# Patient Record
Sex: Male | Born: 1950 | Race: Black or African American | Hispanic: No | Marital: Married | Smoking: Never smoker
Health system: Southern US, Community
[De-identification: ages and names within clinical notes are randomized; demographics above are authoritative.]

## PROBLEM LIST (undated history)

## (undated) DIAGNOSIS — C61 Malignant neoplasm of prostate: Secondary | ICD-10-CM

## (undated) DIAGNOSIS — A159 Respiratory tuberculosis unspecified: Secondary | ICD-10-CM

## (undated) DIAGNOSIS — I10 Essential (primary) hypertension: Secondary | ICD-10-CM

## (undated) DIAGNOSIS — D649 Anemia, unspecified: Secondary | ICD-10-CM

## (undated) DIAGNOSIS — M1990 Unspecified osteoarthritis, unspecified site: Secondary | ICD-10-CM

## (undated) HISTORY — PX: PROSTATE BIOPSY: SHX241

## (undated) HISTORY — PX: TONSILLECTOMY: SUR1361

---

## 1981-06-06 HISTORY — PX: TRANSPHENOIDAL / TRANSNASAL HYPOPHYSECTOMY / RESECTION PITUITARY TUMOR: SUR1382

## 2014-03-03 ENCOUNTER — Ambulatory Visit (HOSPITAL_BASED_OUTPATIENT_CLINIC_OR_DEPARTMENT_OTHER): Attending: Internal Medicine | Admitting: Radiology

## 2014-03-03 VITALS — Ht 70.5 in | Wt 183.0 lb

## 2014-03-03 DIAGNOSIS — G4733 Obstructive sleep apnea (adult) (pediatric): Secondary | ICD-10-CM | POA: Insufficient documentation

## 2014-03-08 DIAGNOSIS — G4733 Obstructive sleep apnea (adult) (pediatric): Secondary | ICD-10-CM

## 2014-03-08 NOTE — Sleep Study (Signed)
   NAME: Frederick Beard DATE OF BIRTH:  1950/12/27 MEDICAL RECORD NUMBER [id number]  LOCATION: [facility]  PHYSICIAN: Zackery Brine D  DATE OF STUDY: 03/03/2014  SLEEP STUDY TYPE: Nocturnal Polysomnogram               REFERRING PHYSICIAN: Willey Blade, MD  INDICATION FOR STUDY: Hypersomnia with sleep apnea  EPWORTH SLEEPINESS SCORE:   14/24 HEIGHT: 5' 10.5" (179.1 cm)  WEIGHT: 183 lb (83.008 kg)    Body mass index is 25.88 kg/(m^2).  NECK SIZE: 14.5 in.  MEDICATIONS: Charted for review  SLEEP ARCHITECTURE: Total sleep time 336 minutes with sleep efficiency 82.8%. Stage I was 11.9%, stage II 74.1%, stage III absent, REM 14% of total sleep time. Sleep latency 12.5 minutes, REM latency 104.5 minutes, awake after sleep onset 57.5 minutes, arousal index 27, bedtime medication: None  RESPIRATORY DATA: Apnea hypopneas syndrome (AHI) 5.2 per hour. 29 total events scored including one obstructive apnea, 2 central apneas, 26 hypopneas. Events were not positional. REM AHI 12.8 per hour. There were not enough events to permit application of split protocol CPAP titration.  OXYGEN DATA: Occasional moderate snoring with oxygen desaturation to a nadir of 89% and mean saturation 93.2% on room air.  CARDIAC DATA: Sinus rhythm with PACs and PVCs  MOVEMENT/PARASOMNIA: A few limb jerks were noted with little effect on sleep. Bathroom x1  IMPRESSION/ RECOMMENDATION:   1) Minimal obstructive sleep apnea/hypopneas syndrome, AHI 5.2 per hour. The normal range for adults is an AHI from 0-5 events per hour and scores are considered mild up to 15 per hour. Non-positional events. REM AHI 12.8 per hour. Occasional moderate snoring with oxygen desaturation to a nadir of 89% and mean saturation 93.2% on room air. 2) There were not enough events to permit application of split CPAP titration by protocol.   Deneise Lever Diplomate, American Board of Sleep Medicine  ELECTRONICALLY SIGNED  ON:  03/08/2014, 10:22 AM [place] PH: [phone]   FX: [phone] [place]

## 2015-03-04 DIAGNOSIS — D353 Benign neoplasm of craniopharyngeal duct: Secondary | ICD-10-CM | POA: Insufficient documentation

## 2015-03-04 DIAGNOSIS — D352 Benign neoplasm of pituitary gland: Secondary | ICD-10-CM | POA: Insufficient documentation

## 2015-03-04 DIAGNOSIS — E291 Testicular hypofunction: Secondary | ICD-10-CM | POA: Insufficient documentation

## 2015-03-04 DIAGNOSIS — E559 Vitamin D deficiency, unspecified: Secondary | ICD-10-CM | POA: Insufficient documentation

## 2015-12-16 ENCOUNTER — Other Ambulatory Visit: Payer: Self-pay | Admitting: Internal Medicine

## 2015-12-16 DIAGNOSIS — R600 Localized edema: Secondary | ICD-10-CM

## 2015-12-21 ENCOUNTER — Ambulatory Visit
Admission: RE | Admit: 2015-12-21 | Discharge: 2015-12-21 | Disposition: A | Discharge status: Home / Self Care | Payer: Medicare Other | Source: Ambulatory Visit | Attending: Internal Medicine | Admitting: Internal Medicine

## 2015-12-21 ENCOUNTER — Other Ambulatory Visit: Payer: Self-pay | Admitting: Internal Medicine

## 2015-12-21 DIAGNOSIS — M25561 Pain in right knee: Secondary | ICD-10-CM

## 2015-12-21 DIAGNOSIS — R600 Localized edema: Secondary | ICD-10-CM

## 2015-12-21 IMAGING — CR DG KNEE COMPLETE 4+V*R*
3 series · 3 of 3 positions shown · non-contrast
Comparison: No prior .

CLINICAL DATA: Chronic right knee pain.  No known injury.

EXAM:
RIGHT KNEE - COMPLETE 4+ VIEW

[w knee ap right (1 of 2)]
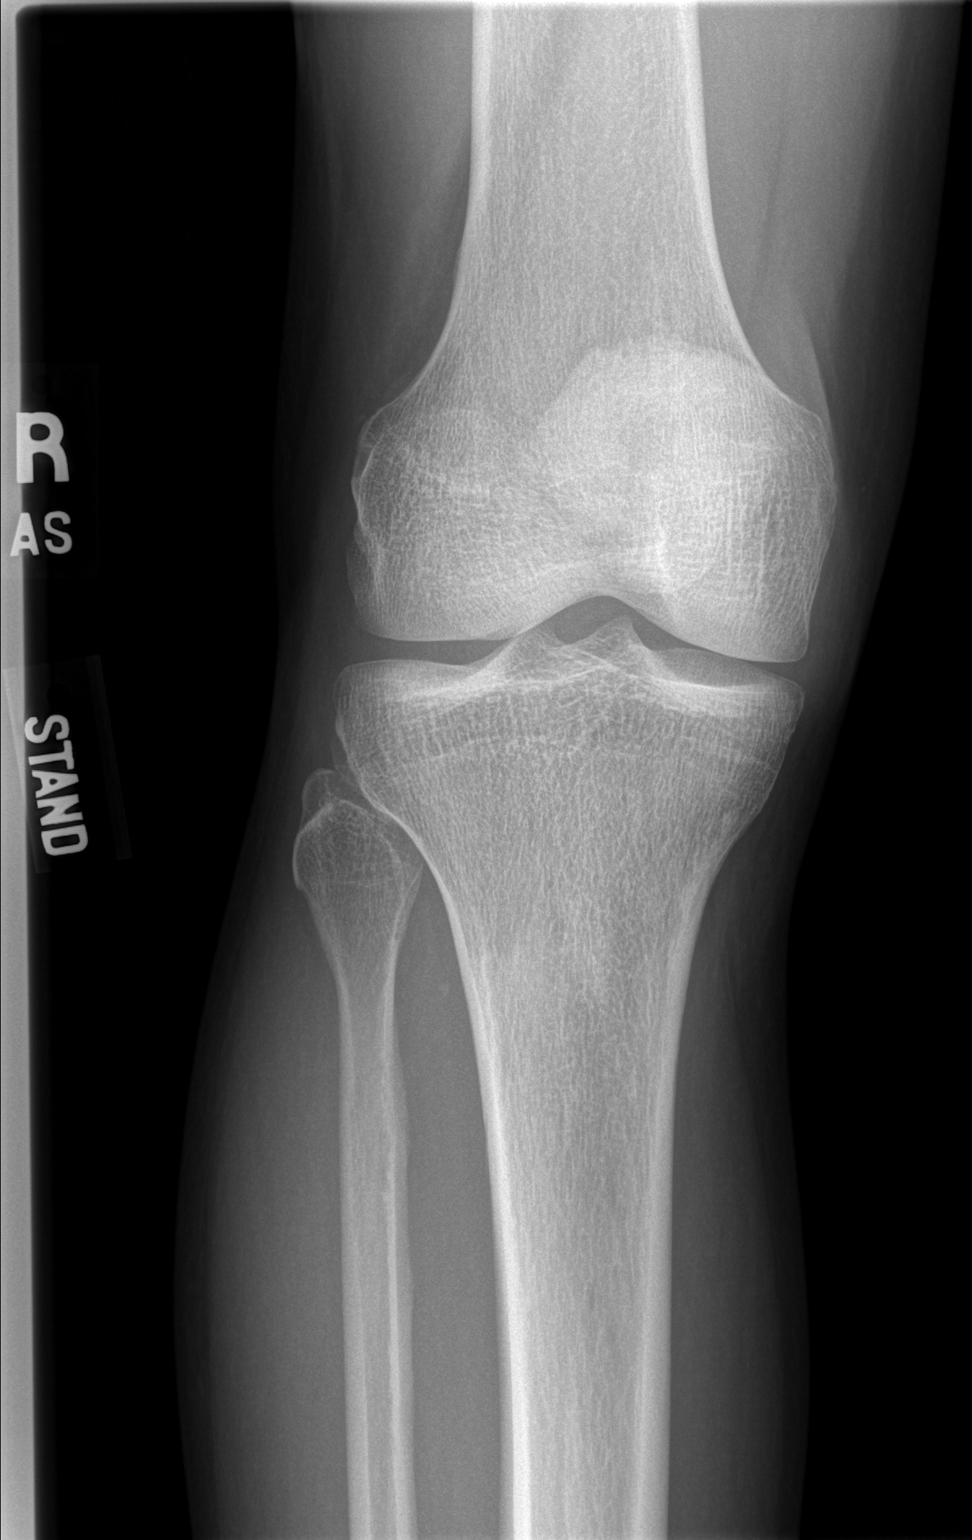

[w knee lat. right]
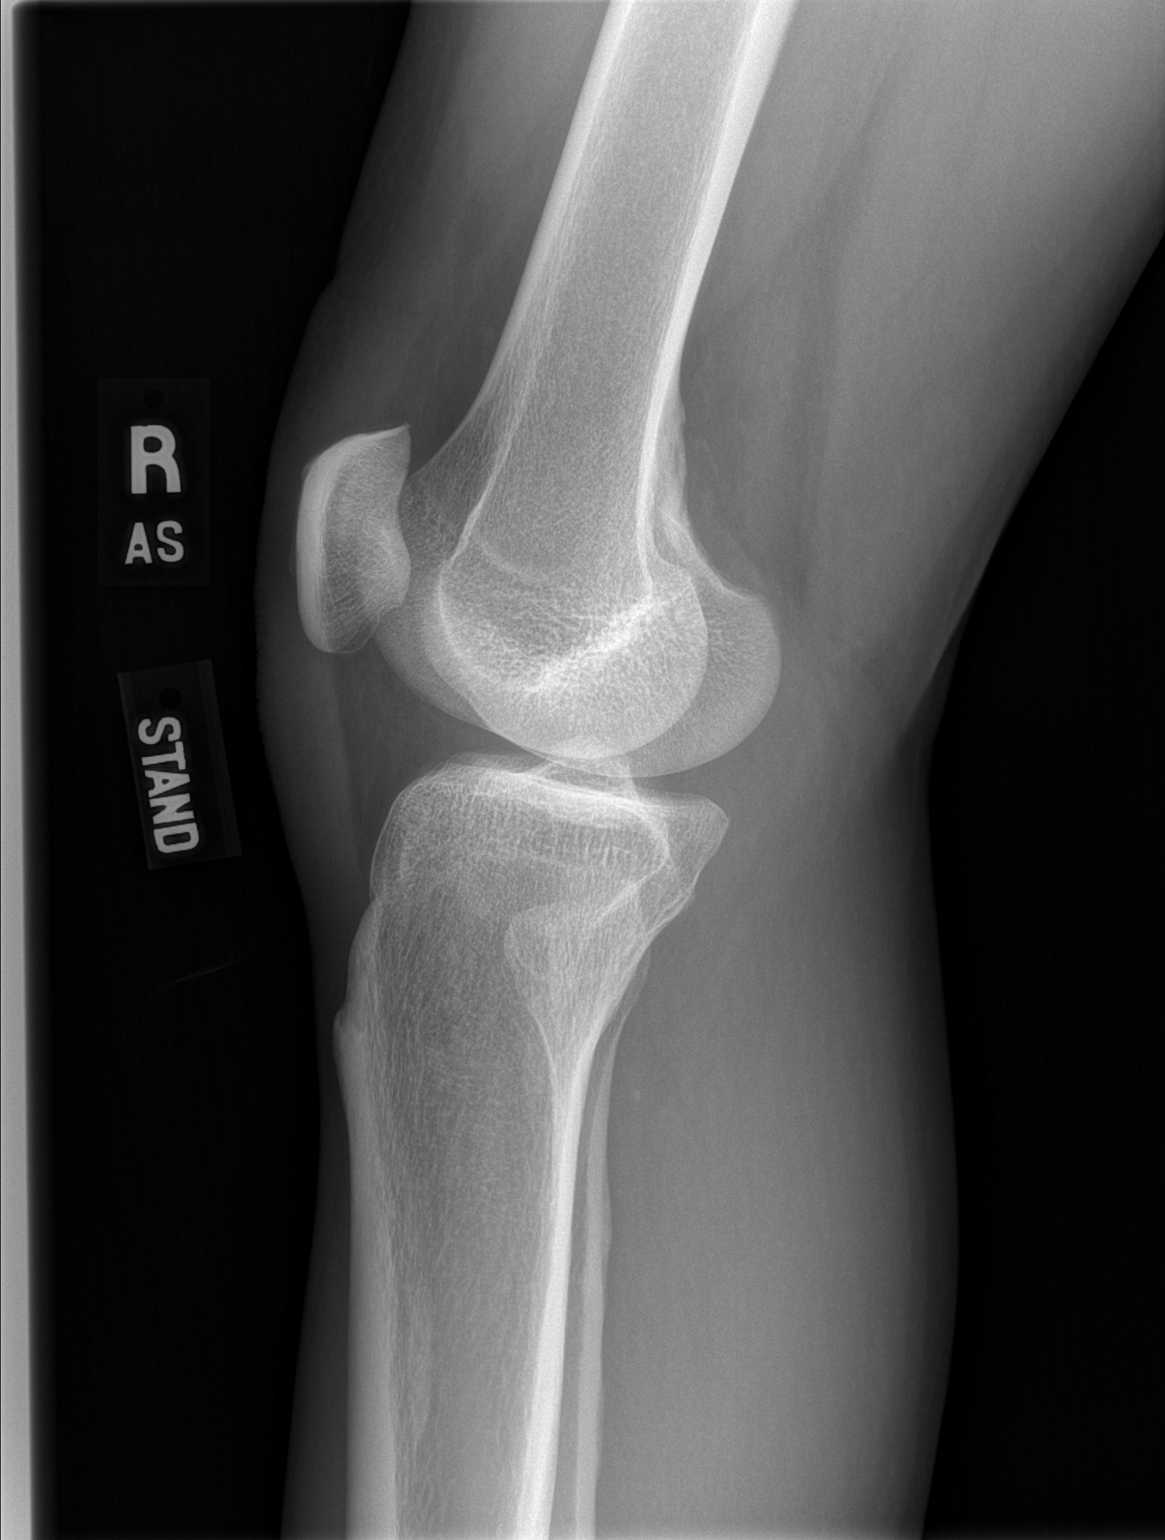

[w knee ap right (2 of 2)]
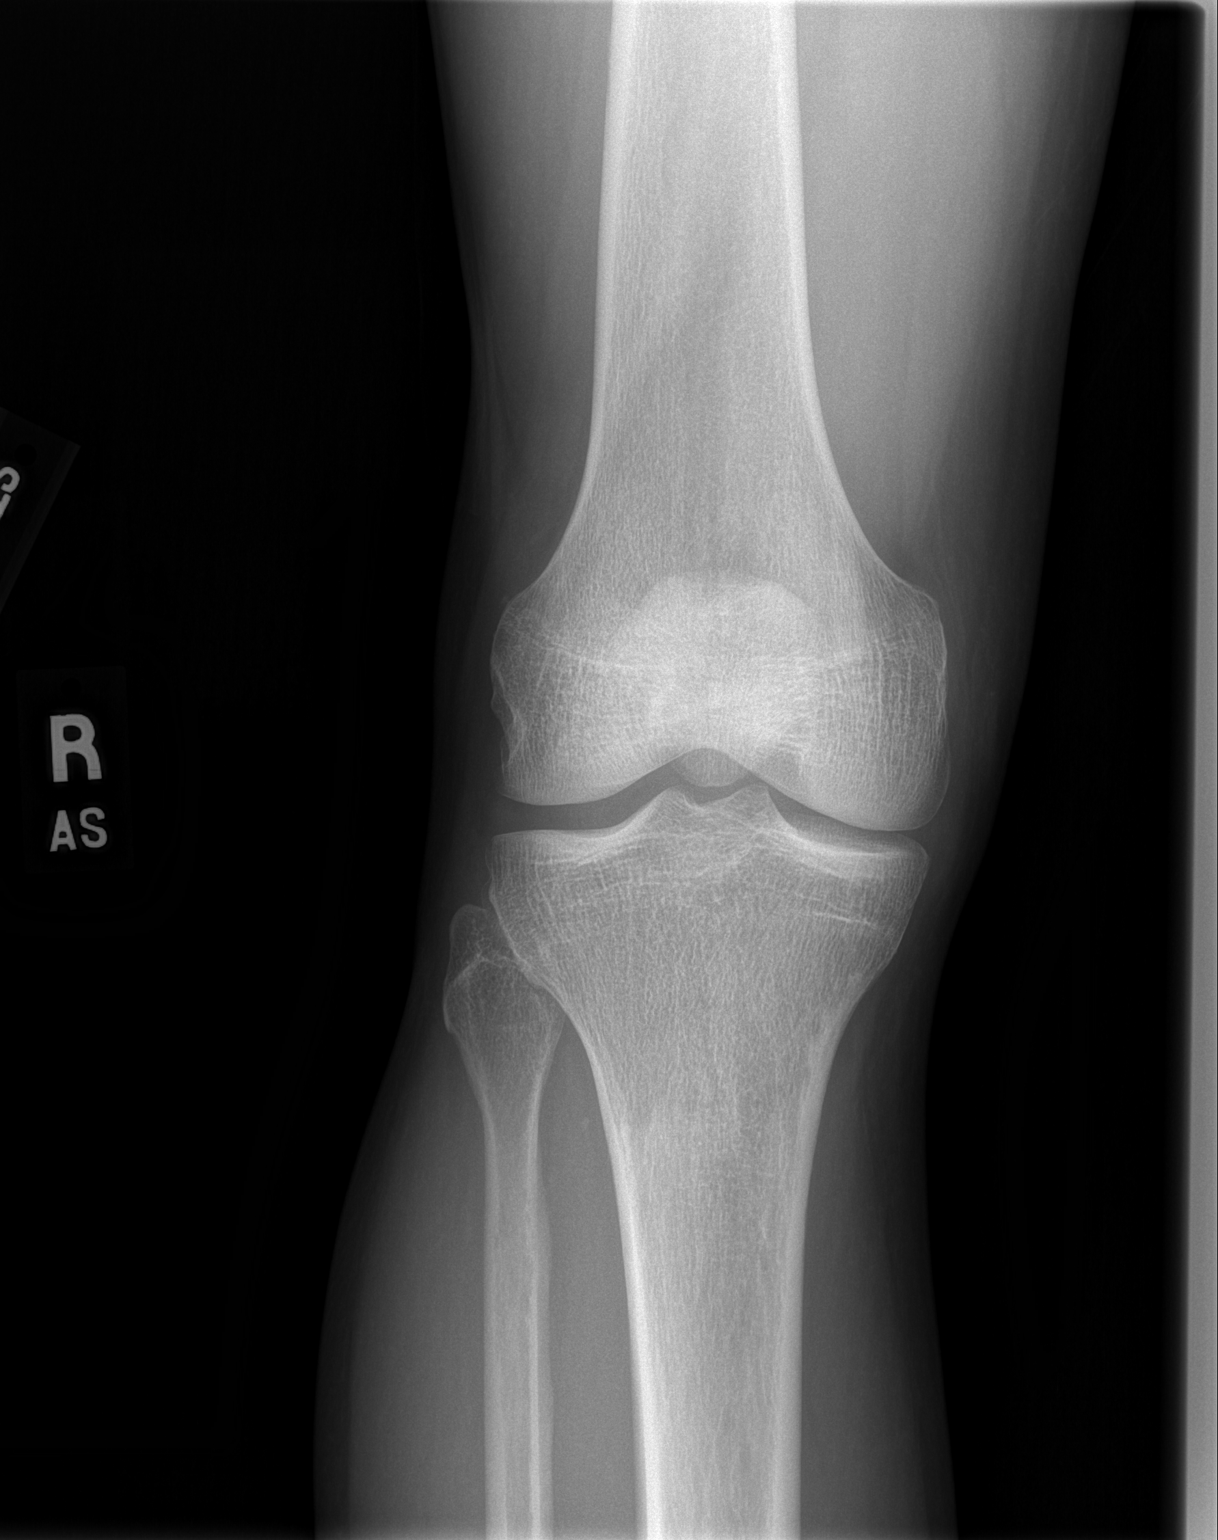

[3 of 3 positions shown; findings below may reference images not displayed]

FINDINGS: No acute bony or joint abnormality identified. No evidence of
fracture or dislocation.
IMPRESSION: No acute or focal abnormality.

## 2015-12-21 IMAGING — CR DG KNEE COMPLETE 4+V*R*
1 series · 1 of 1 positions shown · non-contrast
Comparison: No prior .

CLINICAL DATA: Chronic right knee pain.  No known injury.

EXAM:
RIGHT KNEE - COMPLETE 4+ VIEW

[view not recorded]
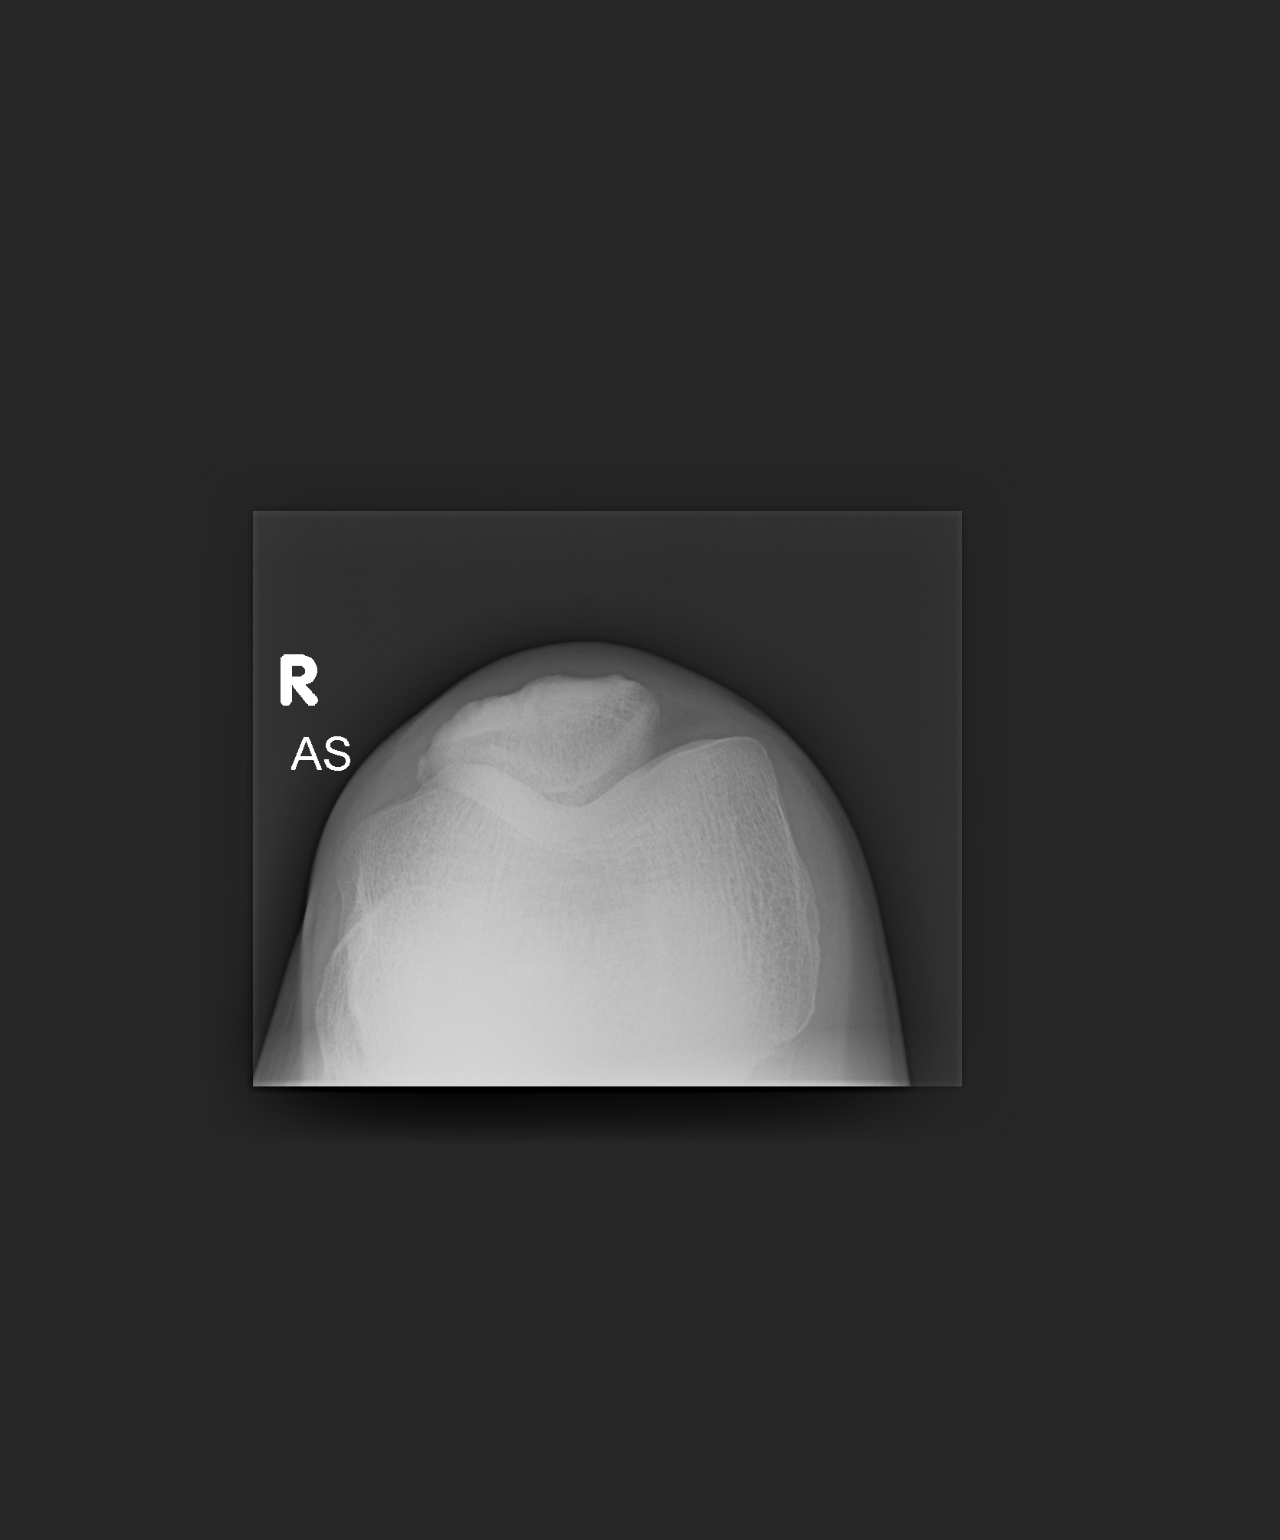

[1 of 1 positions shown; findings below may reference images not displayed]

FINDINGS: No acute bony or joint abnormality identified. No evidence of
fracture or dislocation.
IMPRESSION: No acute or focal abnormality.

## 2015-12-21 IMAGING — US US EXTREM LOW VENOUS*R*
1 series · 13 of 24 positions shown · non-contrast
Comparison: None.

CLINICAL DATA: 65-year-old male with history of right lower
extremity swelling for the past month after [place].



[Series 1: us extrem low venous*right* · 13 of 34 slices shown]
[im 1/34]
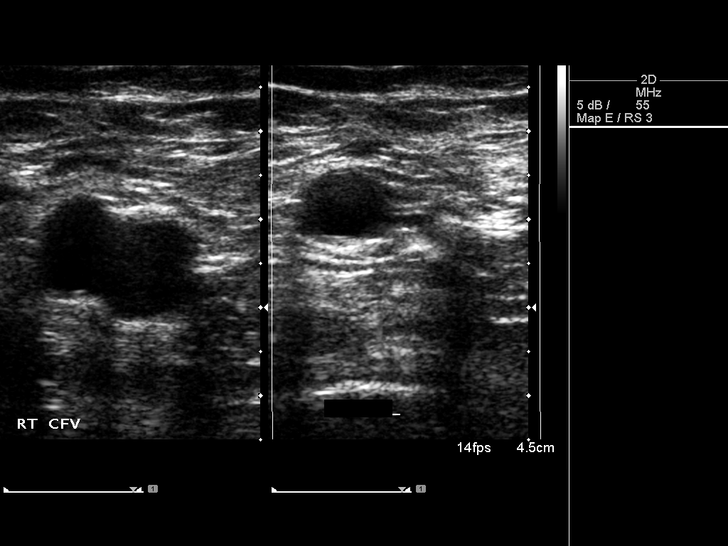
[im 3/34]
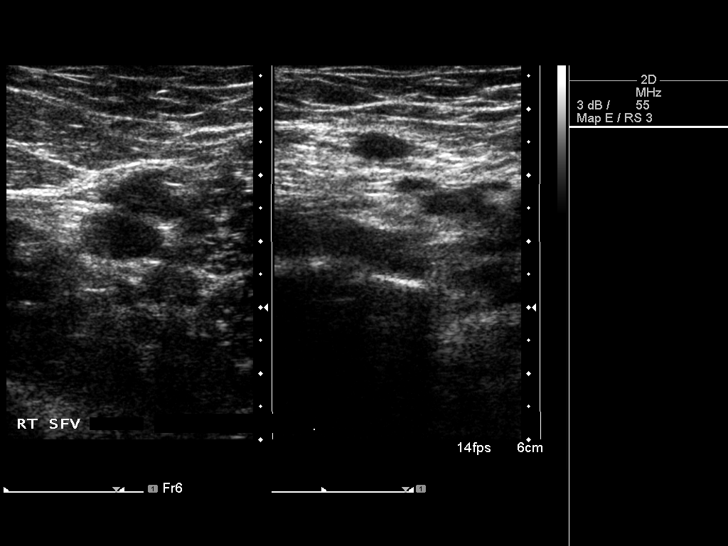
[im 6/34]
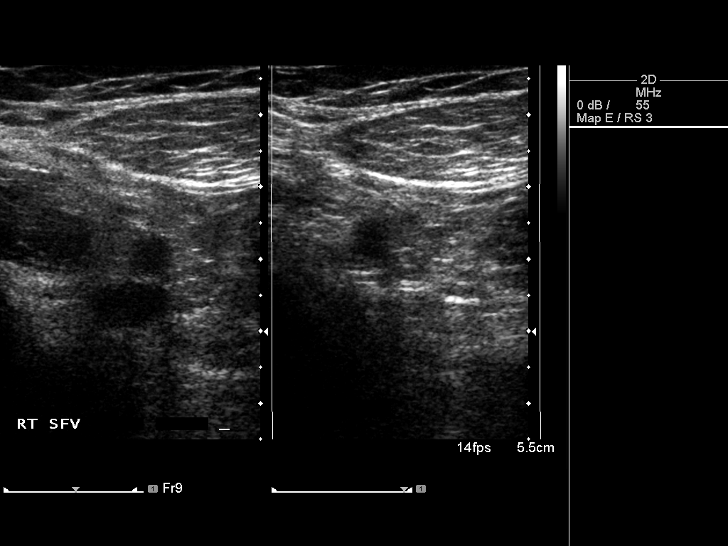
[im 9/34]
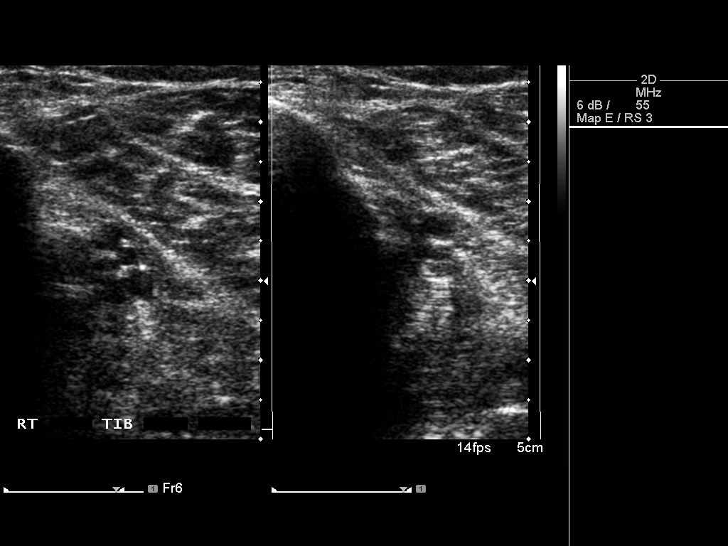
[im 12/34]
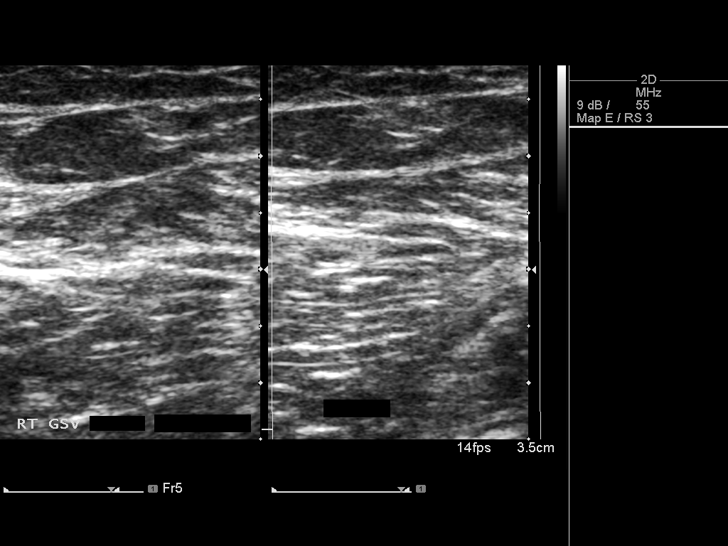
[im 15/34]
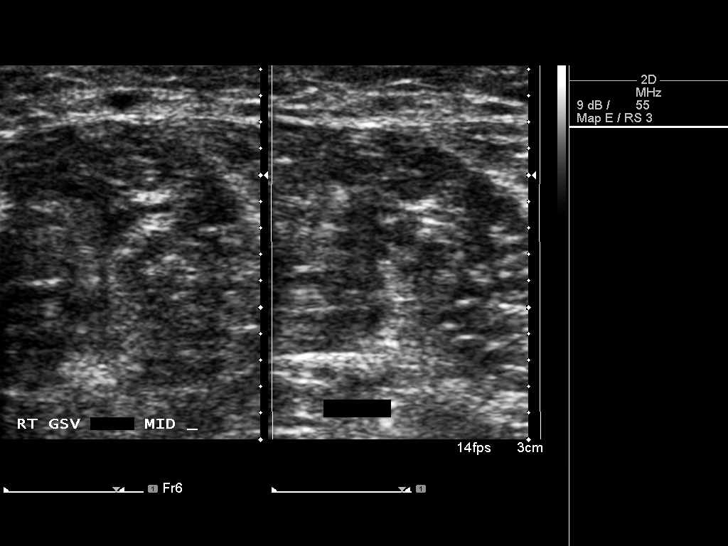
[im 18/34]
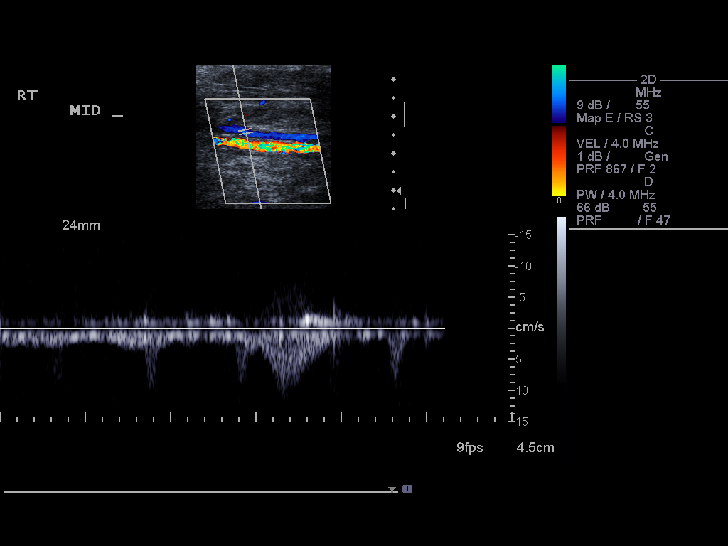
[im 19/34]
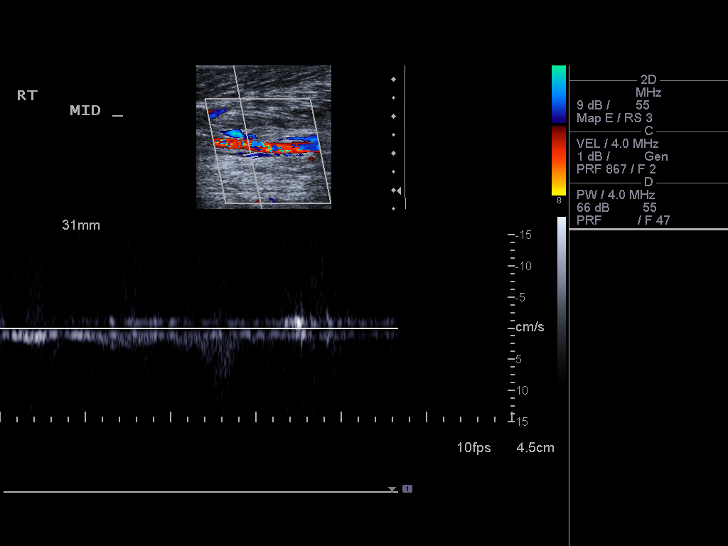
[im 22/34]
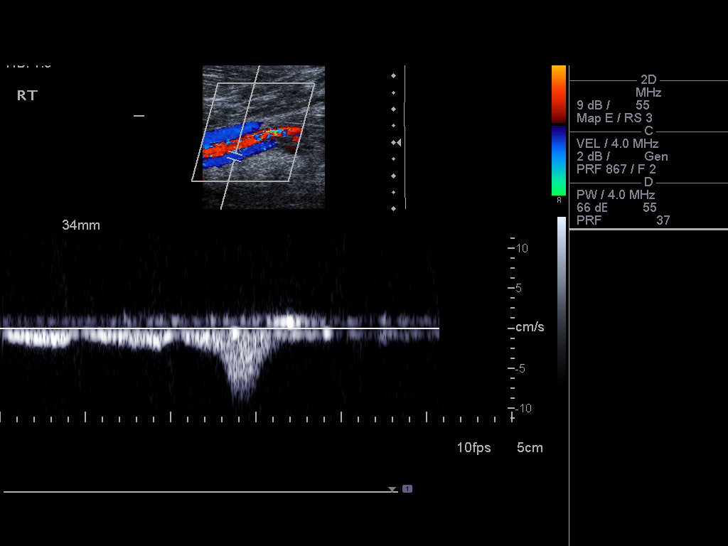
[im 25/34]
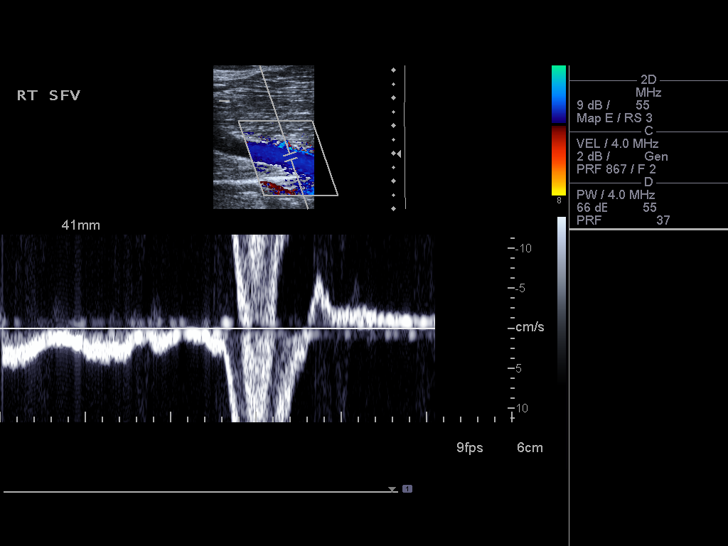
[im 28/34]
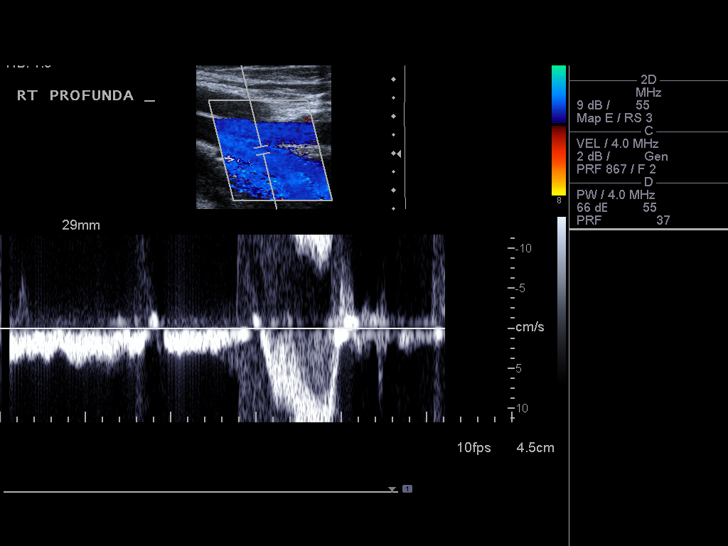
[im 31/34]
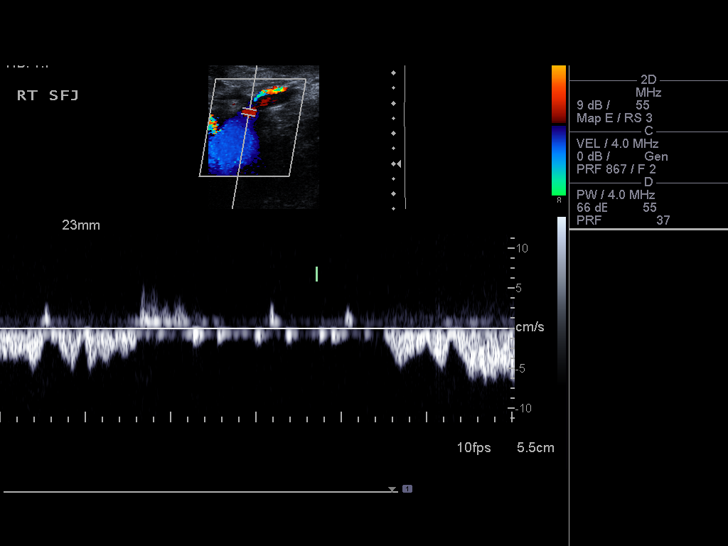
[im 34/34]
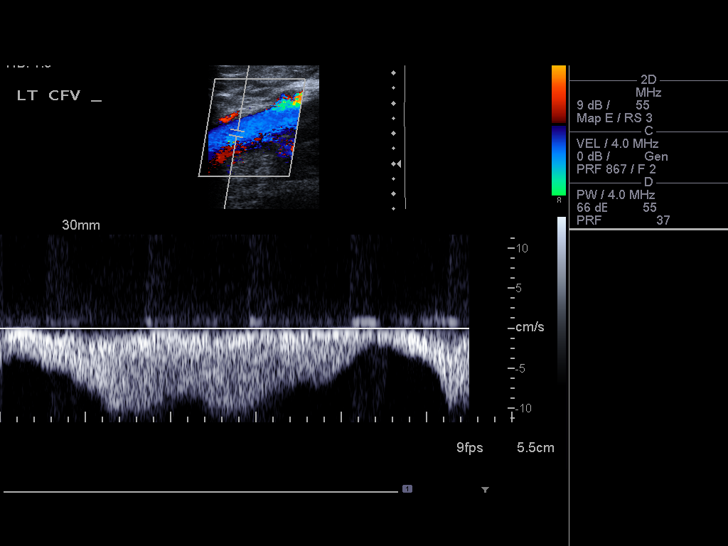

[13 of 24 positions shown; findings below may reference images not displayed]

FINDINGS: Contralateral Common Femoral Vein: Respiratory phasicity is normal
and symmetric with the symptomatic side. No evidence of thrombus.
Normal compressibility.

Common Femoral Vein: No evidence of thrombus. Normal
compressibility, respiratory phasicity and response to augmentation.

Saphenofemoral Junction: No evidence of thrombus. Normal
compressibility and flow on color Doppler imaging.

Profunda Femoral Vein: No evidence of thrombus. Normal
compressibility and flow on color Doppler imaging.

Femoral Vein: No evidence of thrombus. Normal compressibility,
respiratory phasicity and response to augmentation.

Popliteal Vein: No evidence of thrombus. Normal compressibility,
respiratory phasicity and response to augmentation.

Calf Veins: No evidence of thrombus in the posterior tibial vein.
Normal compressibility and flow on color Doppler imaging. The
peroneal vein is not well seen.

Superficial Great Saphenous Vein: No evidence of thrombus. Normal
compressibility and flow on color Doppler imaging.

Venous Reflux:  None.

Other Findings:  None.
IMPRESSION: No evidence of deep venous thrombosis.

## 2016-06-06 HISTORY — PX: FRACTURE SURGERY: SHX138

## 2016-12-29 ENCOUNTER — Ambulatory Visit
Admission: RE | Admit: 2016-12-29 | Discharge: 2016-12-29 | Disposition: A | Discharge status: Home / Self Care | Payer: Medicare Other | Source: Ambulatory Visit | Attending: Internal Medicine | Admitting: Internal Medicine

## 2016-12-29 ENCOUNTER — Other Ambulatory Visit: Payer: Self-pay | Admitting: Internal Medicine

## 2016-12-29 DIAGNOSIS — M25551 Pain in right hip: Secondary | ICD-10-CM

## 2016-12-29 DIAGNOSIS — M7918 Myalgia, other site: Secondary | ICD-10-CM

## 2016-12-29 IMAGING — CR DG HIP (WITH OR WITHOUT PELVIS) 2-3V*R*
3 series · 3 of 3 positions shown · non-contrast
Comparison: No prior.

CLINICAL DATA: Right buttock pain.

EXAM:
DG HIP (WITH OR WITHOUT PELVIS) 2-3V RIGHT

[w pelvis upright]
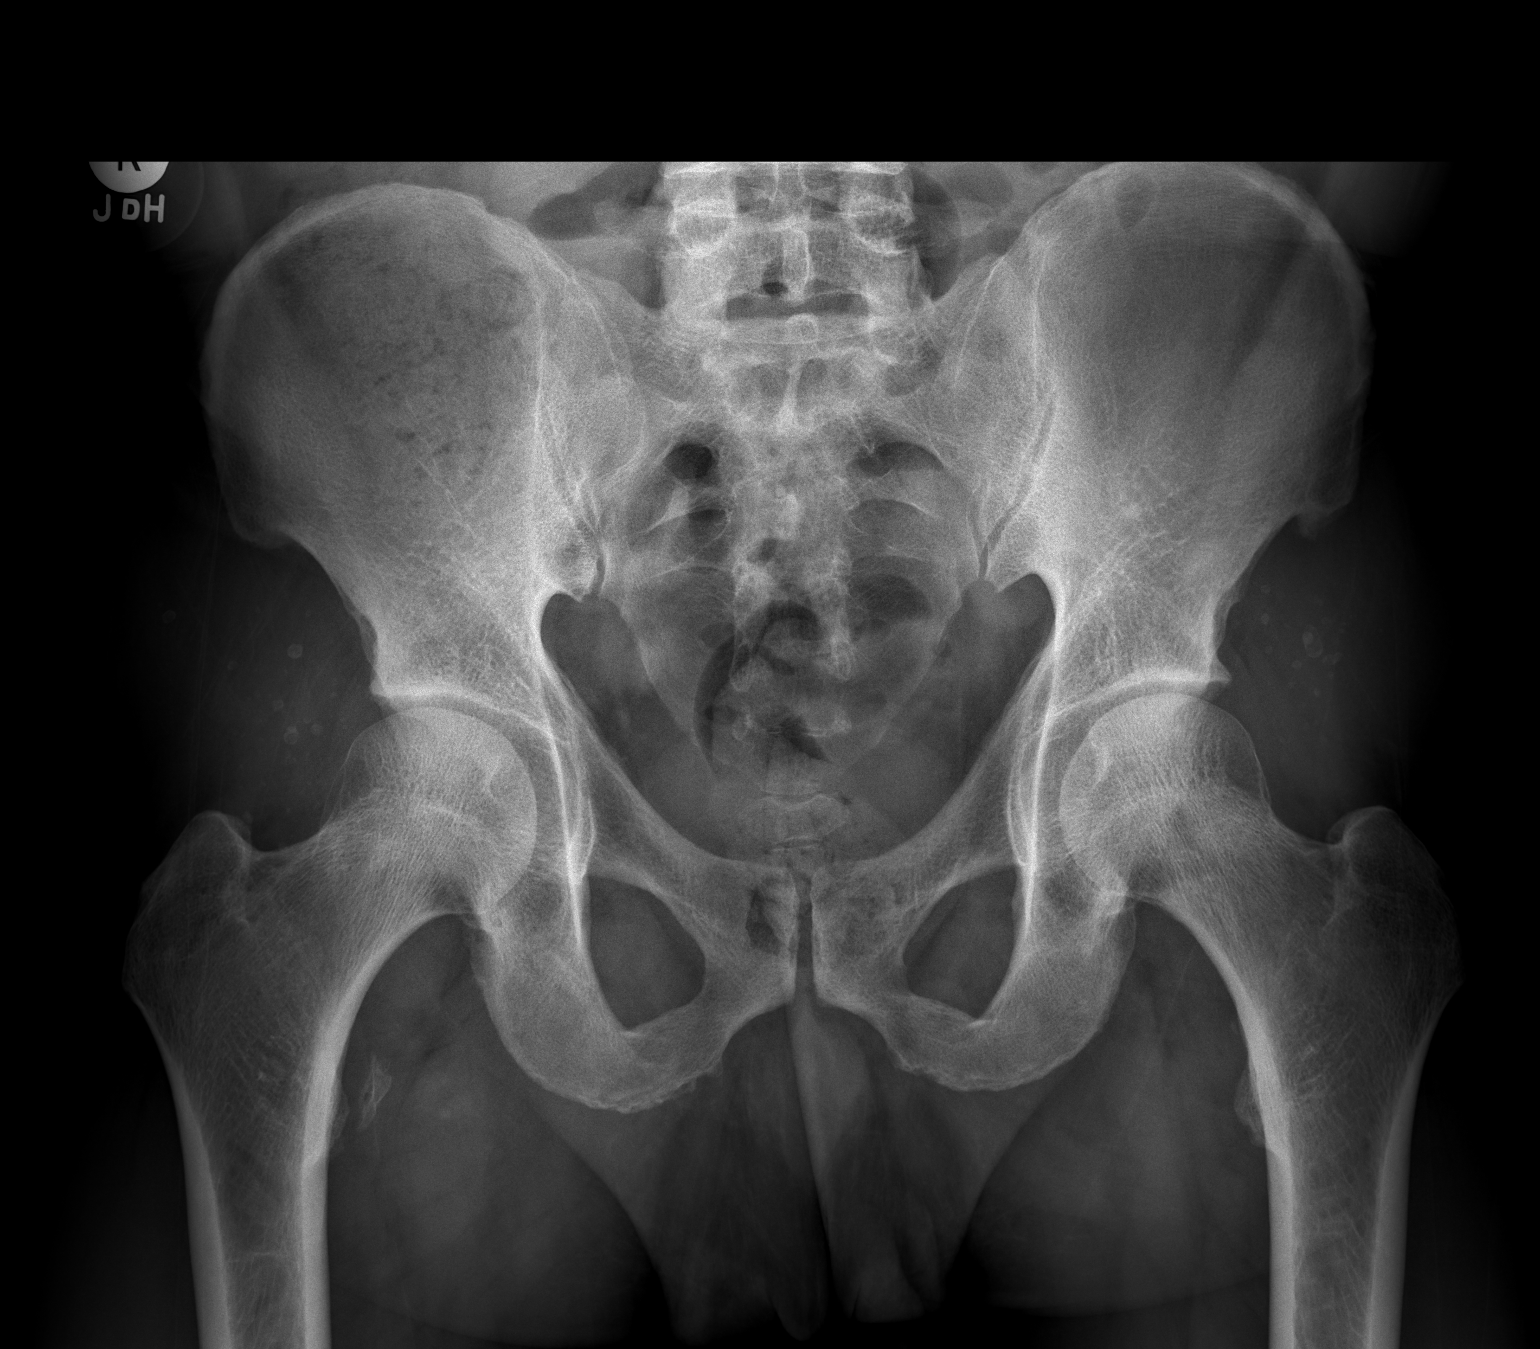

[w hip ap right]
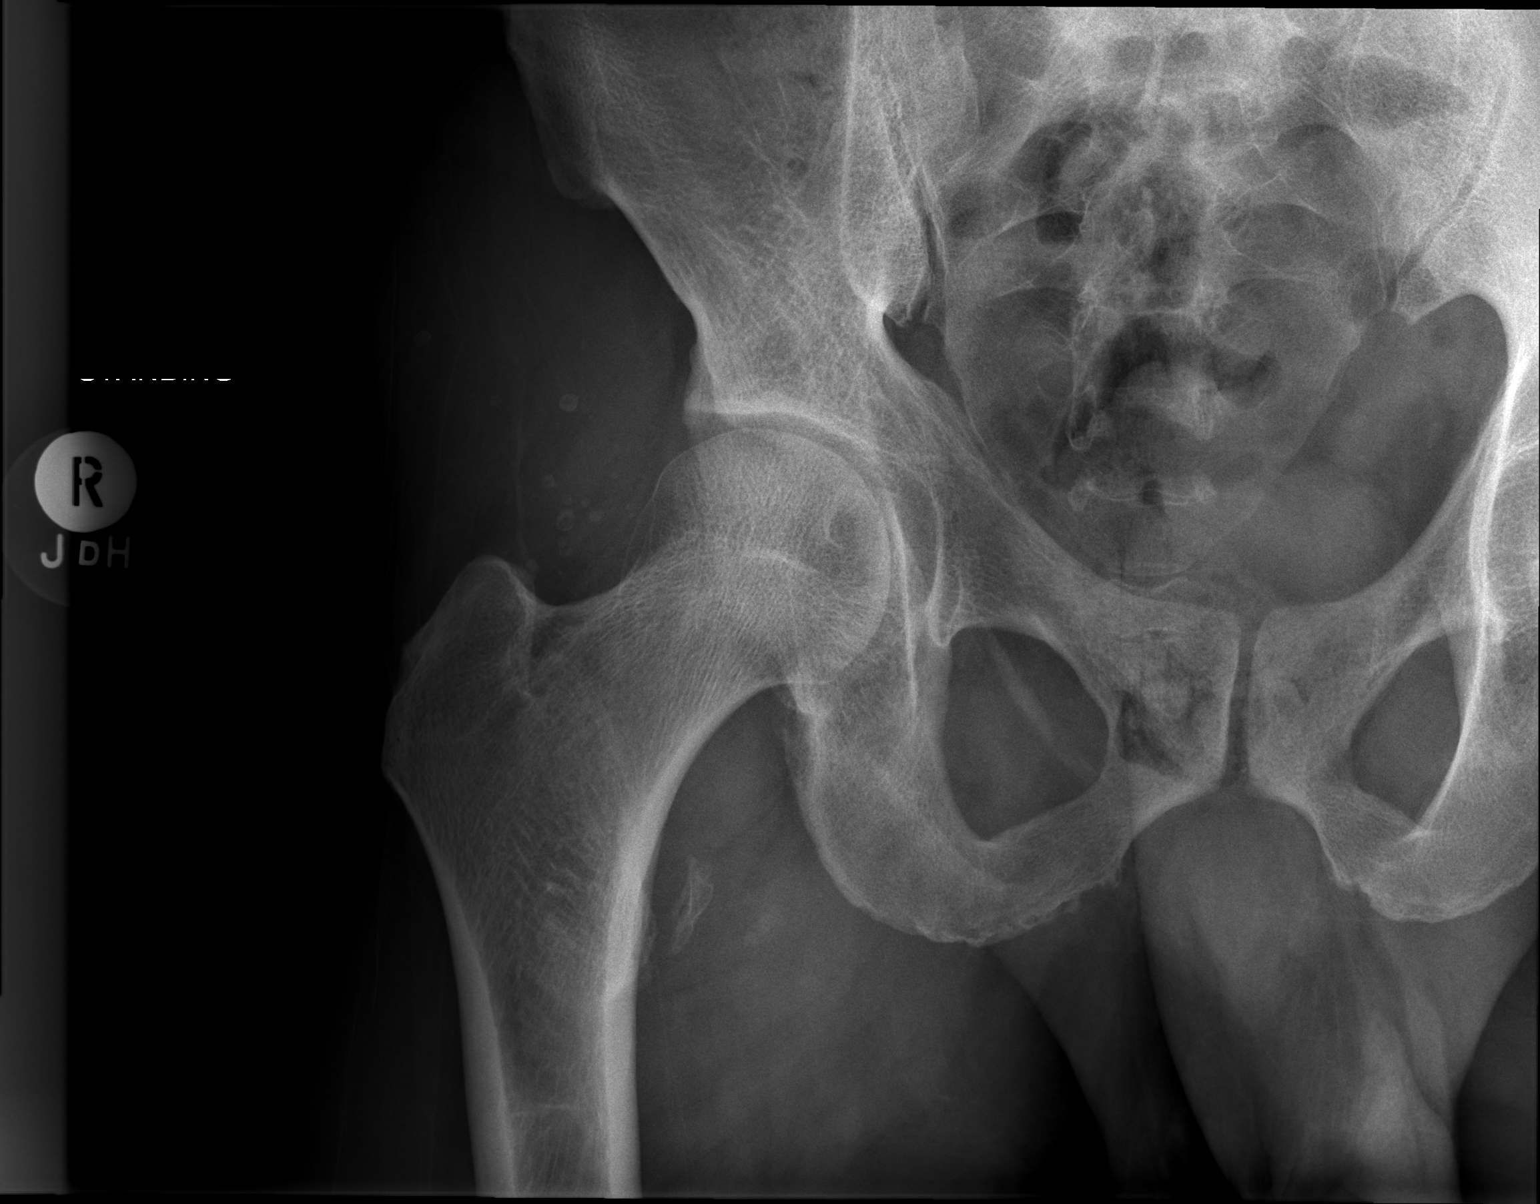

[w hip frog right]
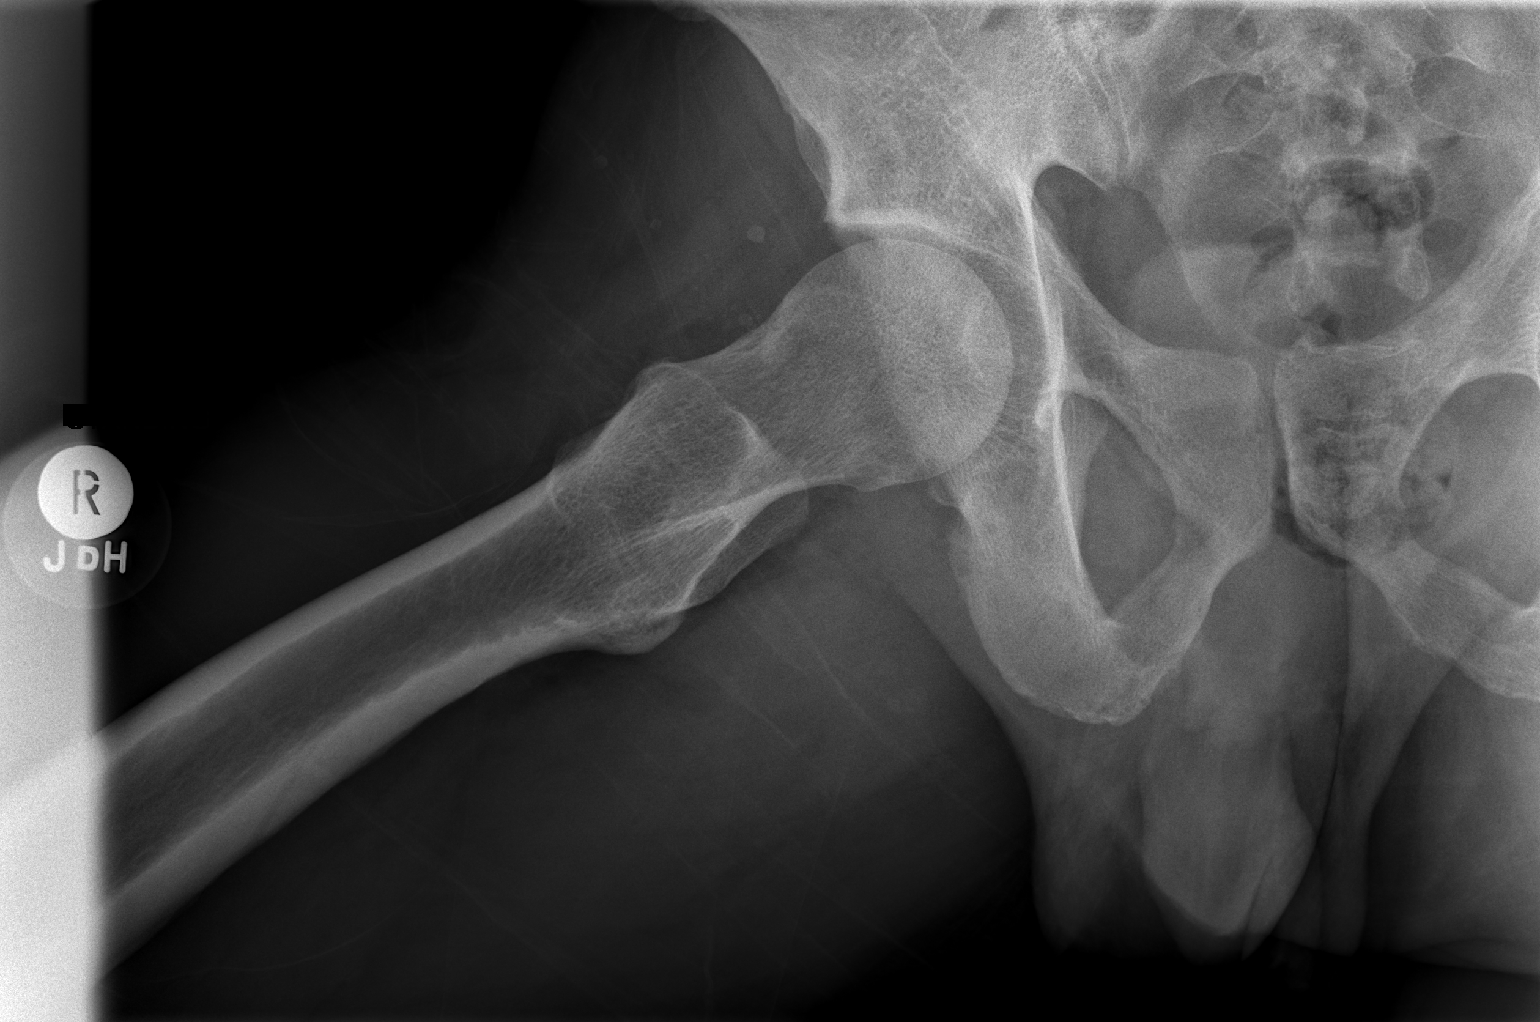

[3 of 3 positions shown; findings below may reference images not displayed]

FINDINGS: No acute bony abnormality identified. No evidence of fracture
dislocation. Corticated calcific density noted along the medial
aspect of the right proximal femur most likely tendinous
ossification. Pelvic calcifications consistent with injection
granulomas.
IMPRESSION: No acute abnormality identified.

## 2018-05-10 ENCOUNTER — Other Ambulatory Visit: Payer: Self-pay | Admitting: Internal Medicine

## 2018-05-10 ENCOUNTER — Ambulatory Visit
Admission: RE | Admit: 2018-05-10 | Discharge: 2018-05-10 | Disposition: A | Discharge status: Home / Self Care | Payer: Medicare Other | Source: Ambulatory Visit | Attending: Internal Medicine | Admitting: Internal Medicine

## 2018-05-10 DIAGNOSIS — M25521 Pain in right elbow: Secondary | ICD-10-CM

## 2018-05-10 IMAGING — CR DG ELBOW 2V*R*
3 series · 3 of 3 positions shown · non-contrast
Comparison: None.

CLINICAL DATA: Posterior right elbow and distal humerus pain
following a fall last night.

EXAM:
RIGHT ELBOW - 2 VIEW

[x elbow ap right]
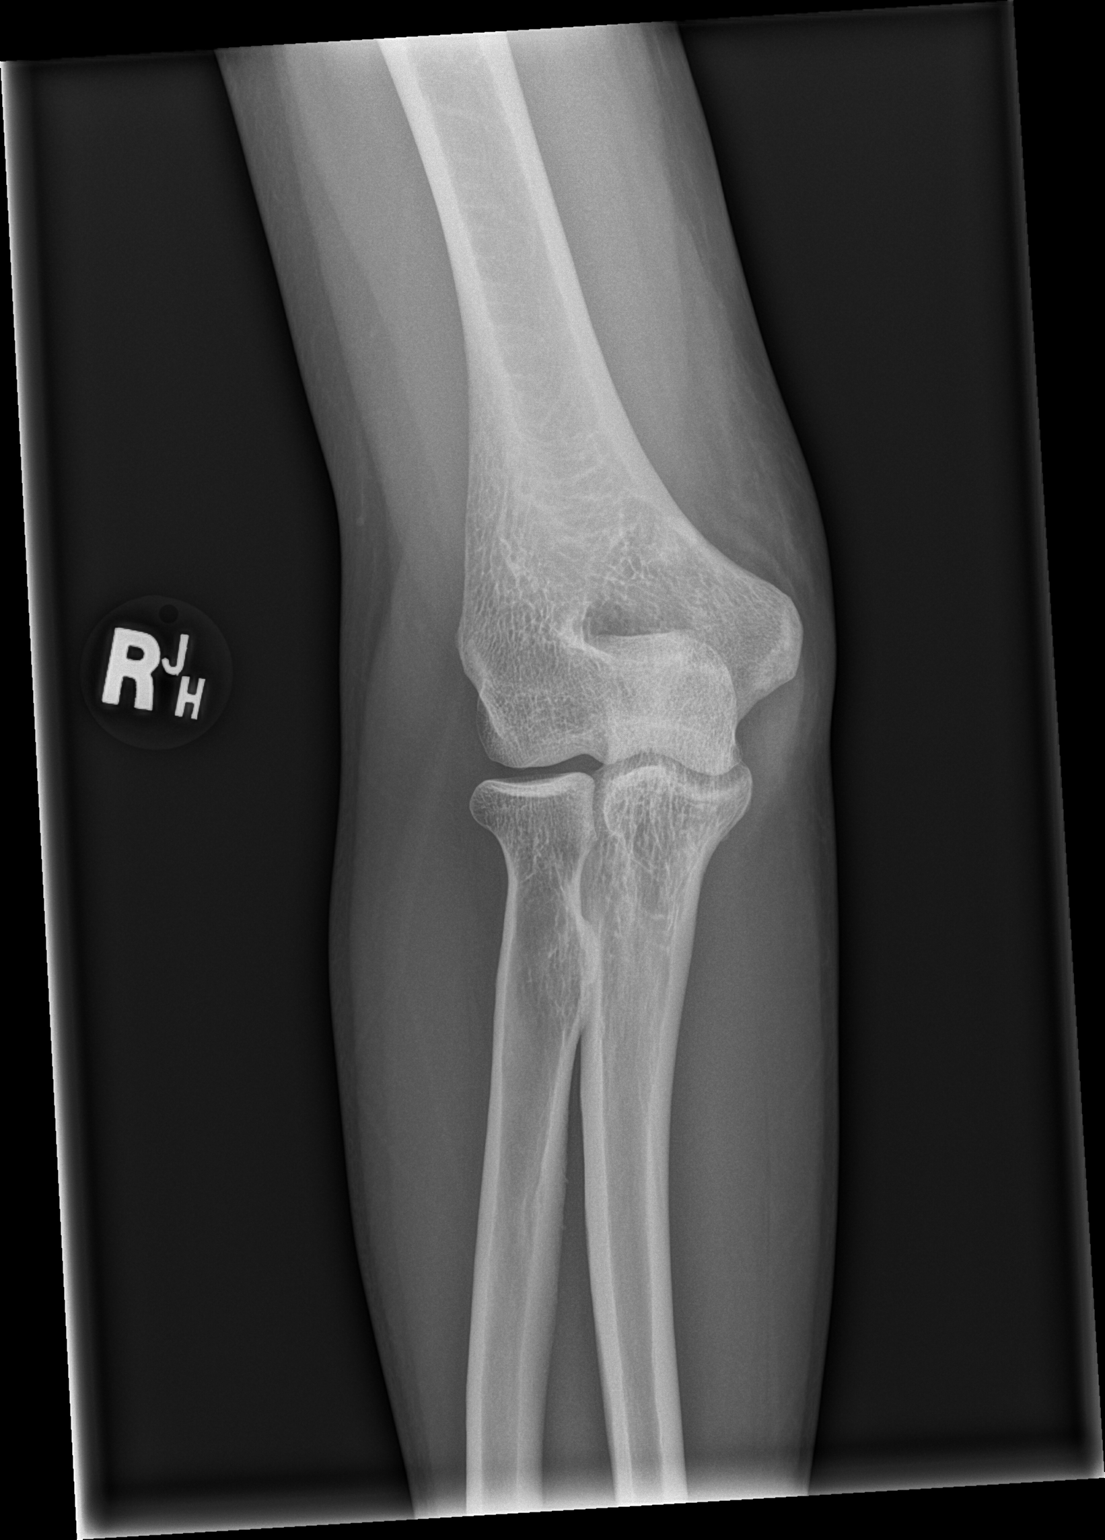

[x elbow lat right (1 of 2)]
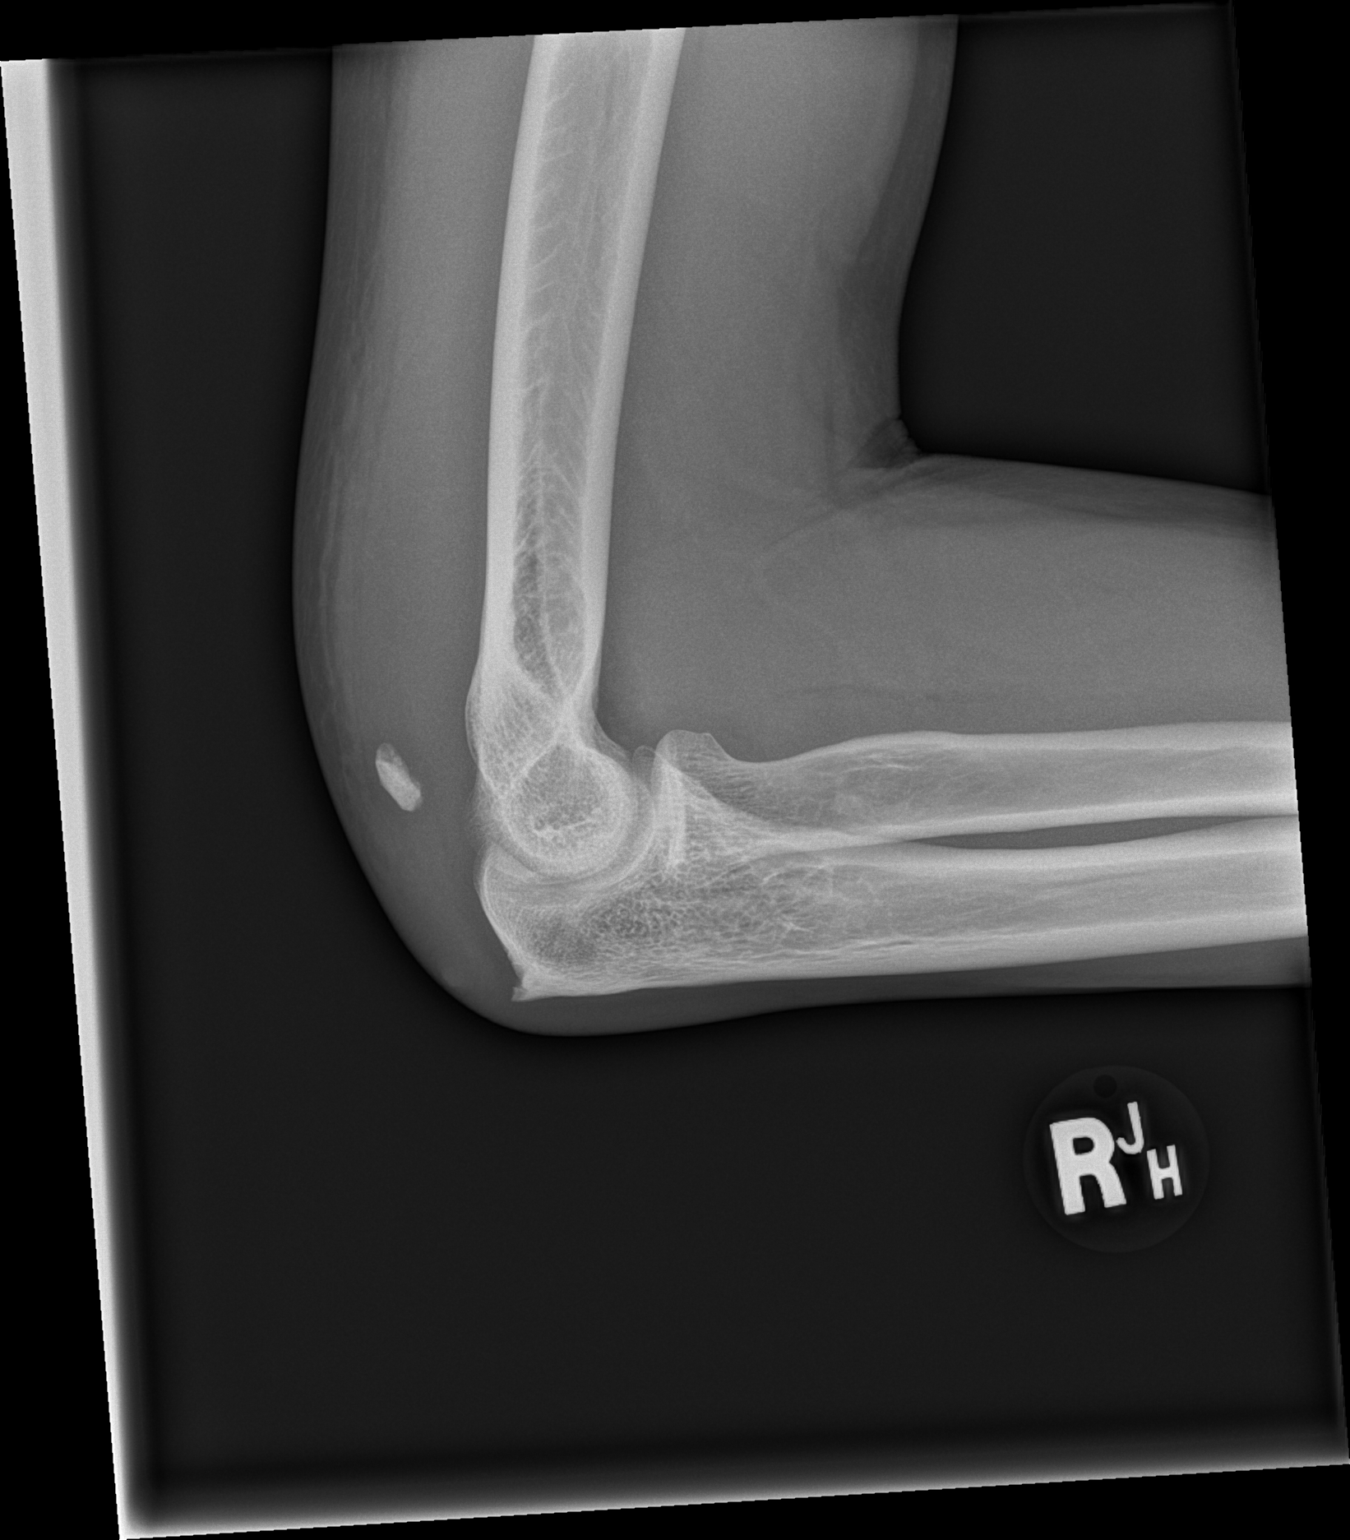

[x elbow lat right (2 of 2)]
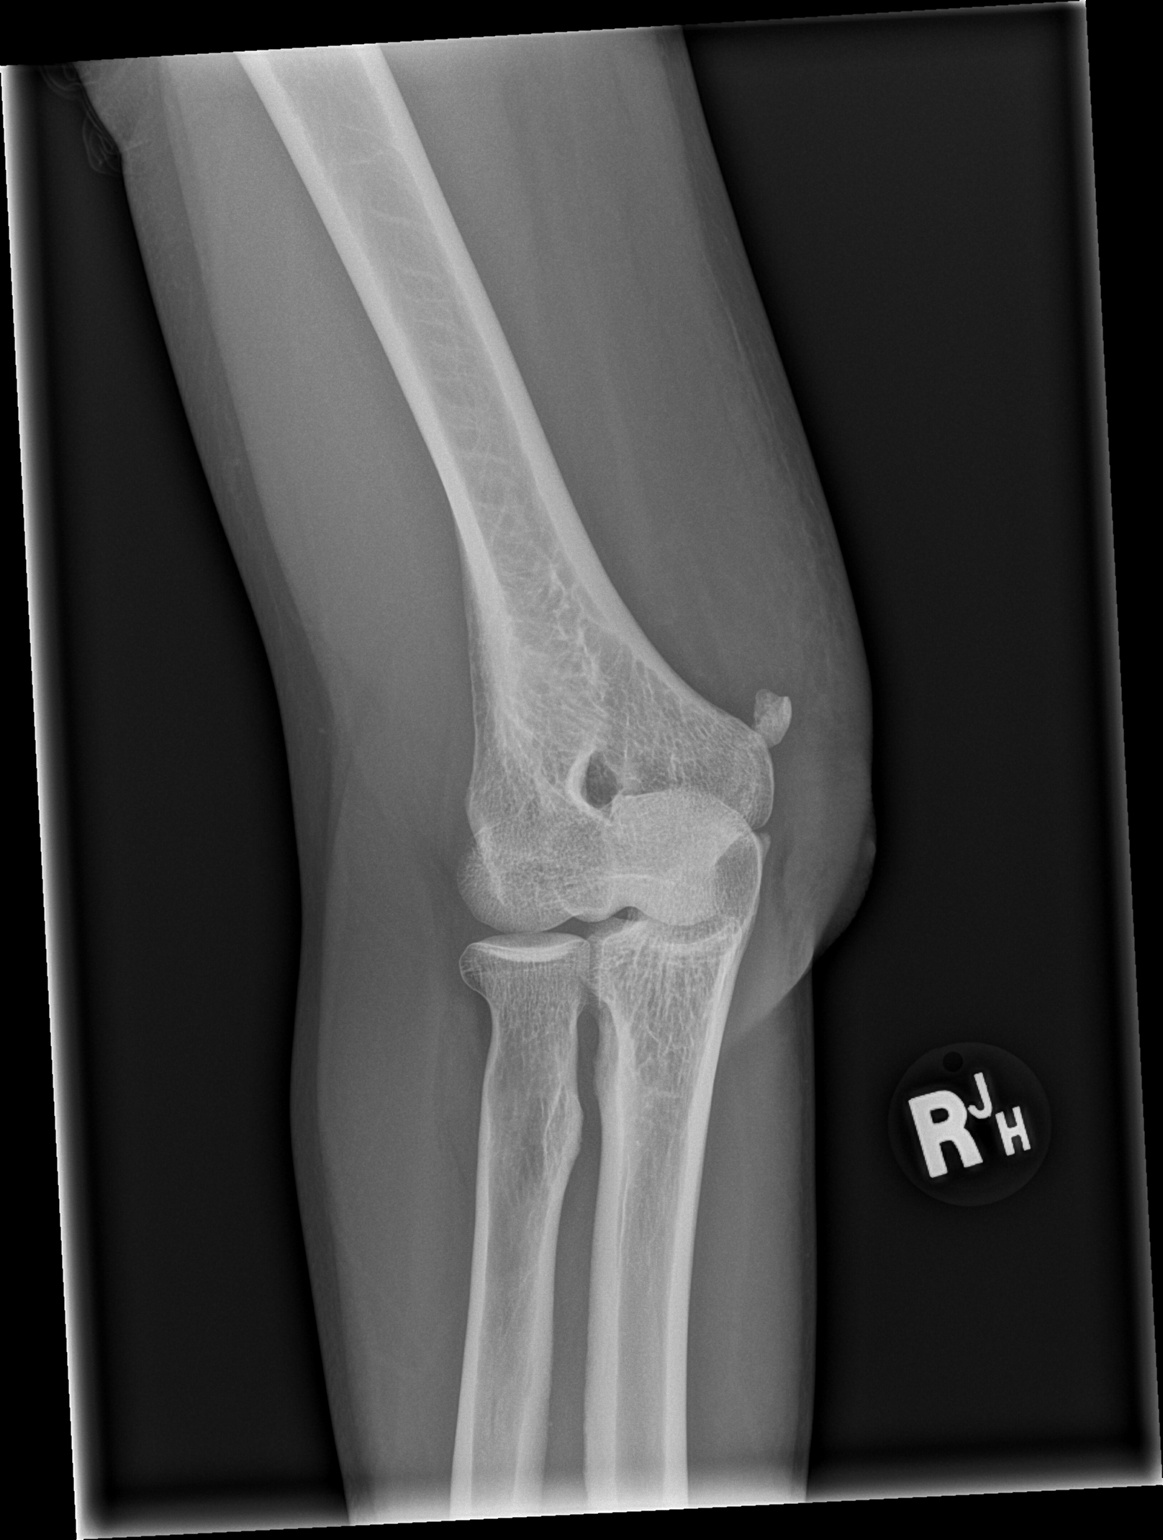

[3 of 3 positions shown; findings below may reference images not displayed]

FINDINGS: Fracture through the base of a moderately large olecranon spur with
3.6 cm of proximal displacement of the spur. Associated posterior
soft tissue swelling and some soft tissue irregularity. No definite
effusion is seen.
IMPRESSION: Displaced olecranon spur fracture, as described above. The
displacement is compatible with retraction of the triceps tendon
attached to the spur (avulsed from the olecranon).

## 2018-05-14 DIAGNOSIS — S52021A Displaced fracture of olecranon process without intraarticular extension of right ulna, initial encounter for closed fracture: Secondary | ICD-10-CM | POA: Insufficient documentation

## 2019-06-30 ENCOUNTER — Ambulatory Visit: Payer: Medicare Other | Attending: Internal Medicine

## 2019-06-30 DIAGNOSIS — Z23 Encounter for immunization: Secondary | ICD-10-CM | POA: Insufficient documentation

## 2019-06-30 NOTE — Progress Notes (Signed)
   Covid-19 Vaccination Clinic  Name:  Frederick Beard    MRN: [id number] DOB: September 12, 1950  06/30/2019  Mr. Frederick Beard was observed post Covid-19 immunization for 15 minutes without incidence. He was provided with Vaccine Information Sheet and instruction to access the V-Safe system.   Mr. Frederick Beard was instructed to call 911 with any severe reactions post vaccine: Marland Kitchen Difficulty breathing  . Swelling of your face and throat  . A fast heartbeat  . A bad rash all over your body  . Dizziness and weakness    Immunizations Administered    Name Date Dose VIS Date Route   Pfizer COVID-19 Vaccine 06/30/2019 12:05 PM 0.3 mL 05/17/2019 Intramuscular   Manufacturer: [place]   Lot: [id number]   [place]: [id number]

## 2019-07-19 ENCOUNTER — Ambulatory Visit: Payer: Medicare Other | Attending: Internal Medicine

## 2019-07-19 DIAGNOSIS — Z23 Encounter for immunization: Secondary | ICD-10-CM | POA: Insufficient documentation

## 2019-07-19 NOTE — Progress Notes (Signed)
   Covid-19 Vaccination Clinic  Name:  Frederick Beard    MRN: [id number] DOB: 12-14-1950  07/19/2019  Mr. Frederick Beard was observed post Covid-19 immunization for 15 minutes without incidence. He was provided with Vaccine Information Sheet and instruction to access the V-Safe system.   Mr. Frederick Beard was instructed to call 911 with any severe reactions post vaccine: Marland Kitchen Difficulty breathing  . Swelling of your face and throat  . A fast heartbeat  . A bad rash all over your body  . Dizziness and weakness    Immunizations Administered    Name Date Dose VIS Date Route   Pfizer COVID-19 Vaccine 07/19/2019 11:56 AM 0.3 mL 05/17/2019 Intramuscular   Manufacturer: [place]   Lot: [id number]   [place]: [id number]

## 2019-08-02 ENCOUNTER — Ambulatory Visit: Payer: Medicare Other

## 2019-08-08 ENCOUNTER — Other Ambulatory Visit: Payer: Self-pay | Admitting: Urology

## 2019-08-08 DIAGNOSIS — R972 Elevated prostate specific antigen [PSA]: Secondary | ICD-10-CM

## 2019-08-17 ENCOUNTER — Ambulatory Visit
Admission: RE | Admit: 2019-08-17 | Discharge: 2019-08-17 | Disposition: A | Discharge status: Home / Self Care | Payer: Medicare Other | Source: Ambulatory Visit | Attending: Urology | Admitting: Urology

## 2019-08-17 DIAGNOSIS — R972 Elevated prostate specific antigen [PSA]: Secondary | ICD-10-CM

## 2019-08-17 IMAGING — MR MR PROSTATE WO/W CM
55 of 56 series · 55 of 56 positions shown · IV contrast (17 ml multihance)
Comparison: None.

CLINICAL DATA: Elevated PSA, high-grade PIN at the right apex on
biopsy in [DI]

EXAM:
MR PROSTATE WITHOUT AND WITH CONTRAST
TECHNIQUE: Multiplanar multisequence MRI images were obtained of the pelvis
centered about the prostate. Pre and post contrast images were
obtained.
CONTRAST:  17mL MULTIHANCE GADOBENATE DIMEGLUMINE 529 MG/ML IV SOLN
Creatinine was obtained on site at [HOSPITAL] at [HOSPITAL].
Results: Creatinine 1.4 mg/dL.

[Series 2: new loc · axial · 6.0mm · 0.88mm/px · 1 of 17 slices shown]
[im 1/17]
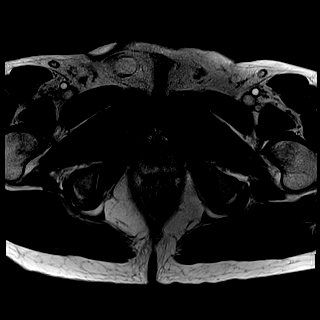

[Series 3: bSSFP fat-sat · axial · 8.0mm · 0.74mm/px · 1 of 28 slices shown]
[im 1/28]
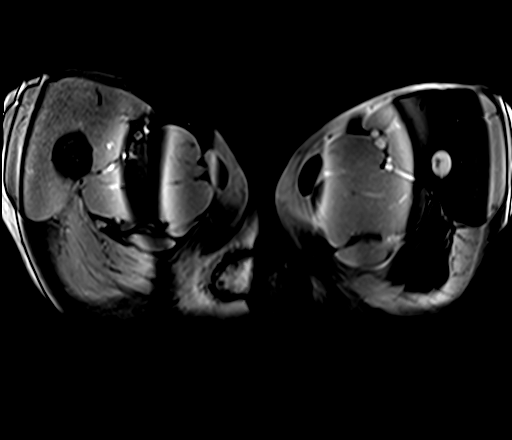

[Series 4: T1 · axial · 5.0mm · 1.25mm/px · 1 of 80 slices shown]
[im 1/80]
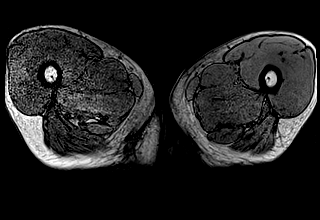

[Series 5: T2 · coronal · 3.5mm · 0.56mm/px · 1 of 23 slices shown (1 of 3)]
[im 1/23]
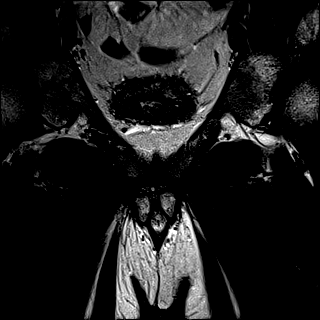

[Series 6: DWI · axial · 3.5mm · 1.75mm/px · 1 of 65 slices shown (1 of 3)]
[im 1/65]
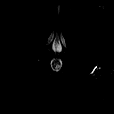

[Series 7: DWI · axial · 3.5mm · 1.75mm/px · 1 of 22 slices shown (2 of 3)]
[im 1/22]
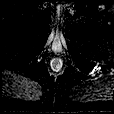

[Series 8: DWI · axial · 3.5mm · 1.56mm/px · 1 of 22 slices shown (3 of 3)]
[im 1/22]
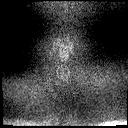

[Series 9: T2 · axial · 3.5mm · 0.56mm/px · 1 of 23 slices shown (2 of 3)]
[im 1/23]
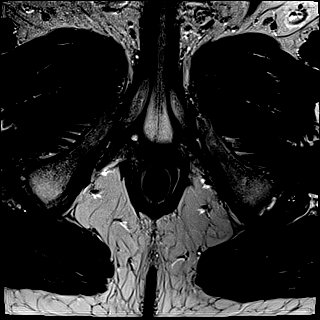

[Series 10: T2 · axial · 1.0mm · 1.04mm/px · 1 of 80 slices shown (3 of 3)]
[im 1/80]
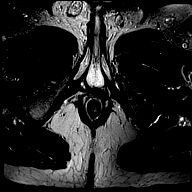

[Series 11: pre t1_twist_tra_dyn_ttc=6.4s · axial · non-contrast · 3.5mm · 0.83mm/px · 1 of 24 slices shown]
[im 1/24]
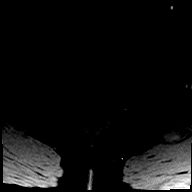

[Series 12: post t1_twist_tra_dyn-copy center · axial · 3.5mm · 0.83mm/px · 1 of 24 slices shown (1 of 23)]
[im 1/24]
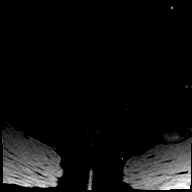

[Series 13: post t1_twist_tra_dyn-copy center · axial · 3.5mm · 0.83mm/px · 1 of 24 slices shown (2 of 23)]
[im 1/24]
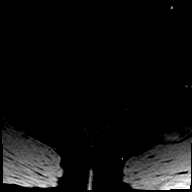

[Series 14: post t1_twist_tra_dyn-copy cent_sub_ttc=(id) · axial · 3.5mm · 0.83mm/px · 1 of 17 slices shown (1 of 22)]
[im 1/17]
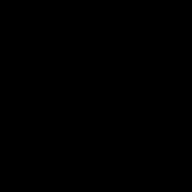

[Series 15: post t1_twist_tra_dyn-copy center · axial · 3.5mm · 0.83mm/px · 1 of 24 slices shown (3 of 23)]
[im 1/24]
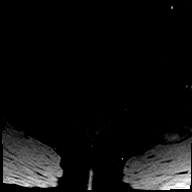

[Series 16: post t1_twist_tra_dyn-copy cent_sub_ttc=(id) · axial · 3.5mm · 0.83mm/px · 1 of 20 slices shown (2 of 22)]
[im 1/20]
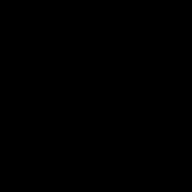

[Series 17: post t1_twist_tra_dyn-copy center · axial · 3.5mm · 0.83mm/px · 1 of 24 slices shown (4 of 23)]
[im 1/24]
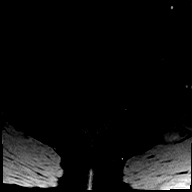

[Series 18: post t1_twist_tra_dyn-copy cent_sub_ttc=(id) · axial · 3.5mm · 0.83mm/px · 1 of 16 slices shown (3 of 22)]
[im 1/16]
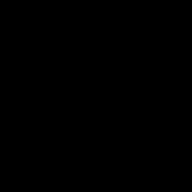

[Series 19: post t1_twist_tra_dyn-copy center · axial · 3.5mm · 0.83mm/px · 1 of 24 slices shown (5 of 23)]
[im 1/24]
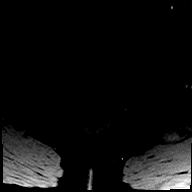

[Series 20: post t1_twist_tra_dyn-copy cent_sub_ttc=(id) · axial · 3.5mm · 0.83mm/px · 1 of 23 slices shown (4 of 22)]
[im 1/23]
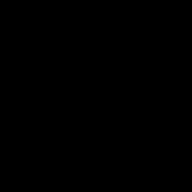

[Series 21: post t1_twist_tra_dyn-copy center · axial · 3.5mm · 0.83mm/px · 1 of 24 slices shown (6 of 23)]
[im 1/24]
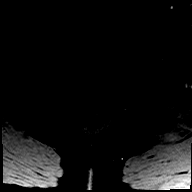

[Series 22: post t1_twist_tra_dyn-copy cent_sub_ttc=(id) · axial · 3.5mm · 0.83mm/px · 1 of 24 slices shown (5 of 22)]
[im 1/24]
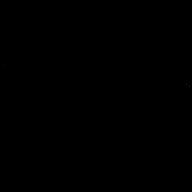

[Series 23: post t1_twist_tra_dyn-copy center · axial · 3.5mm · 0.83mm/px · 1 of 24 slices shown (7 of 23)]
[im 1/24]
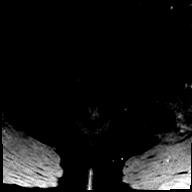

[Series 24: post t1_twist_tra_dyn-copy cent_sub_ttc=(id) · axial · 3.5mm · 0.83mm/px · 1 of 24 slices shown (6 of 22)]
[im 1/24]
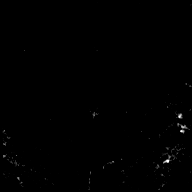

[Series 25: post t1_twist_tra_dyn-copy center · axial · 3.5mm · 0.83mm/px · 1 of 24 slices shown (8 of 23)]
[im 1/24]
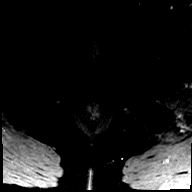

[Series 26: post t1_twist_tra_dyn-copy cent_sub_ttc=(id) · axial · 3.5mm · 0.83mm/px · 1 of 24 slices shown (7 of 22)]
[im 1/24]
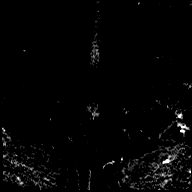

[Series 27: post t1_twist_tra_dyn-copy center · axial · 3.5mm · 0.83mm/px · 1 of 24 slices shown (9 of 23)]
[im 1/24]
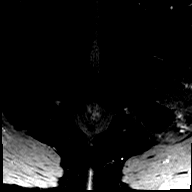

[Series 28: post t1_twist_tra_dyn-copy cent_sub_ttc=(id) · axial · 3.5mm · 0.83mm/px · 1 of 24 slices shown (8 of 22)]
[im 1/24]
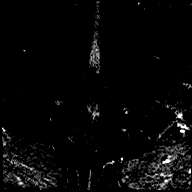

[Series 29: post t1_twist_tra_dyn-copy center · axial · 3.5mm · 0.83mm/px · 1 of 24 slices shown (10 of 23)]
[im 1/24]
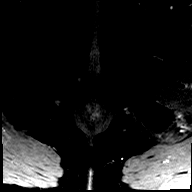

[Series 30: post t1_twist_tra_dyn-copy cent_sub_ttc=(id) · axial · 3.5mm · 0.83mm/px · 1 of 24 slices shown (9 of 22)]
[im 1/24]
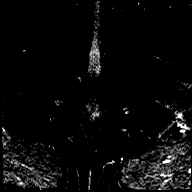

[Series 31: post t1_twist_tra_dyn-copy center · axial · 3.5mm · 0.83mm/px · 1 of 24 slices shown (11 of 23)]
[im 1/24]
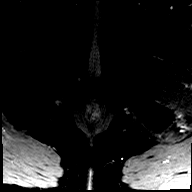

[Series 32: post t1_twist_tra_dyn-copy cent_sub_ttc=(id) · axial · 3.5mm · 0.83mm/px · 1 of 24 slices shown (10 of 22)]
[im 1/24]
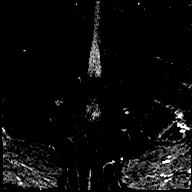

[Series 33: post t1_twist_tra_dyn-copy center · axial · 3.5mm · 0.83mm/px · 1 of 24 slices shown (12 of 23)]
[im 1/24]
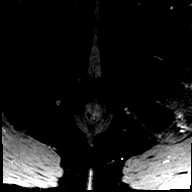

[Series 34: post t1_twist_tra_dyn-copy cent_sub_ttc=(id) · axial · 3.5mm · 0.83mm/px · 1 of 24 slices shown (11 of 22)]
[im 1/24]
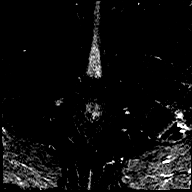

[Series 35: post t1_twist_tra_dyn-copy center · axial · 3.5mm · 0.83mm/px · 1 of 24 slices shown (13 of 23)]
[im 1/24]
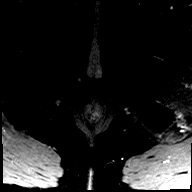

[Series 36: post t1_twist_tra_dyn-copy cent_sub_ttc=(id) · axial · 3.5mm · 0.83mm/px · 1 of 24 slices shown (12 of 22)]
[im 1/24]
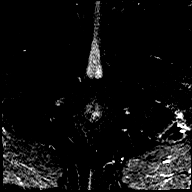

[Series 37: post t1_twist_tra_dyn-copy center · axial · 3.5mm · 0.83mm/px · 1 of 24 slices shown (14 of 23)]
[im 1/24]
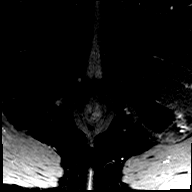

[Series 38: post t1_twist_tra_dyn-copy cent_sub_ttc=(id) · axial · 3.5mm · 0.83mm/px · 1 of 24 slices shown (13 of 22)]
[im 1/24]
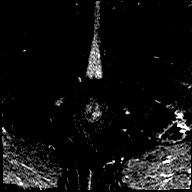

[Series 39: post t1_twist_tra_dyn-copy center · axial · 3.5mm · 0.83mm/px · 1 of 24 slices shown (15 of 23)]
[im 1/24]
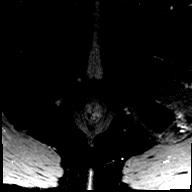

[Series 40: post t1_twist_tra_dyn-copy cent_sub_ttc=(id) · axial · 3.5mm · 0.83mm/px · 1 of 24 slices shown (14 of 22)]
[im 1/24]
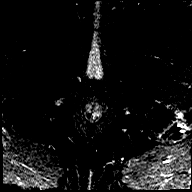

[Series 41: post t1_twist_tra_dyn-copy center · axial · 3.5mm · 0.83mm/px · 1 of 24 slices shown (16 of 23)]
[im 1/24]
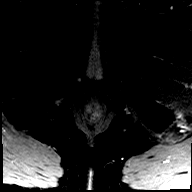

[Series 42: post t1_twist_tra_dyn-copy cent_sub_ttc=(id) · axial · 3.5mm · 0.83mm/px · 1 of 24 slices shown (15 of 22)]
[im 1/24]
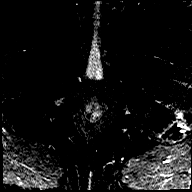

[Series 43: post t1_twist_tra_dyn-copy center · axial · 3.5mm · 0.83mm/px · 1 of 24 slices shown (17 of 23)]
[im 1/24]
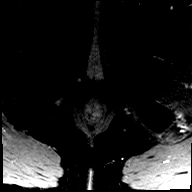

[Series 44: post t1_twist_tra_dyn-copy cent_sub_ttc=(id) · axial · 3.5mm · 0.83mm/px · 1 of 24 slices shown (16 of 22)]
[im 1/24]
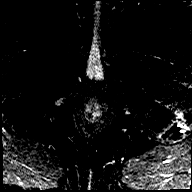

[Series 45: post t1_twist_tra_dyn-copy center · axial · 3.5mm · 0.83mm/px · 1 of 24 slices shown (18 of 23)]
[im 1/24]
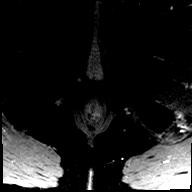

[Series 46: post t1_twist_tra_dyn-copy cent_sub_ttc=(id) · axial · 3.5mm · 0.83mm/px · 1 of 24 slices shown (17 of 22)]
[im 1/24]
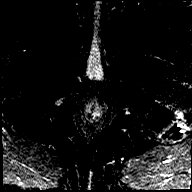

[Series 47: post t1_twist_tra_dyn-copy center · axial · 3.5mm · 0.83mm/px · 1 of 24 slices shown (19 of 23)]
[im 1/24]
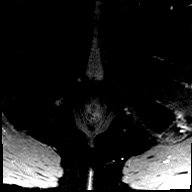

[Series 48: post t1_twist_tra_dyn-copy cent_sub_ttc=(id) · axial · 3.5mm · 0.83mm/px · 1 of 24 slices shown (18 of 22)]
[im 1/24]
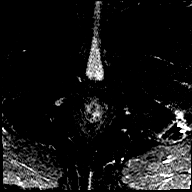

[Series 49: post t1_twist_tra_dyn-copy center · axial · 3.5mm · 0.83mm/px · 1 of 24 slices shown (20 of 23)]
[im 1/24]
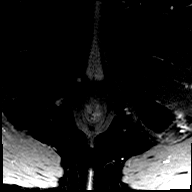

[Series 50: post t1_twist_tra_dyn-copy cent_sub_ttc=(id) · axial · 3.5mm · 0.83mm/px · 1 of 24 slices shown (19 of 22)]
[im 1/24]
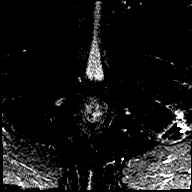

[Series 51: post t1_twist_tra_dyn-copy center · axial · 3.5mm · 0.83mm/px · 1 of 24 slices shown (21 of 23)]
[im 1/24]
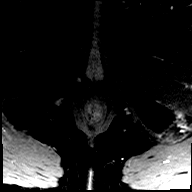

[Series 52: post t1_twist_tra_dyn-copy cent_sub_ttc=(id) · axial · 3.5mm · 0.83mm/px · 1 of 24 slices shown (20 of 22)]
[im 1/24]
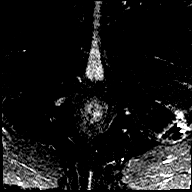

[Series 53: post t1_twist_tra_dyn-copy center · axial · 3.5mm · 0.83mm/px · 1 of 24 slices shown (22 of 23)]
[im 1/24]
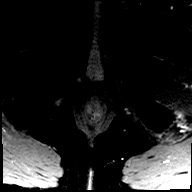

[Series 54: post t1_twist_tra_dyn-copy cent_sub_ttc=(id) · axial · 3.5mm · 0.83mm/px · 1 of 24 slices shown (21 of 22)]
[im 1/24]
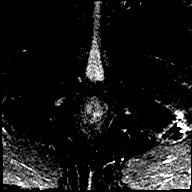

[Series 55: post t1_twist_tra_dyn-copy center · axial · 3.5mm · 0.83mm/px · 1 of 24 slices shown (23 of 23)]
[im 1/24]
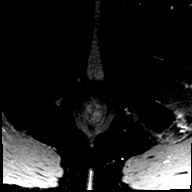

[Series 56: post t1_twist_tra_dyn-copy cent_sub_ttc=(id) · axial · 3.5mm · 0.83mm/px · 1 of 24 slices shown (22 of 22)]
[im 1/24]
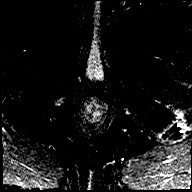

[55 of 56 positions shown; findings below may reference images not displayed]

FINDINGS: Prostate: No findings suspicious for high-grade macroscopic prostate
cancer on MRI.

Specifically, no focal low T2 lesion within the peripheral zone. No
restricted diffusion/low ADC. No early arterial enhancement. PI-RADS
1.

Mild enlargement/nodularity of the central gland, suggesting BPH,
including exophytic central gland nodularity posteriorly in the
medial mid gland bilaterally (series 9/image 12). No suspicious
central gland nodule on T2.

Volume: 3.6 x 5.3 x 3.8 cm (volume = 38 mL)

Transcapsular spread:  Absent.

Seminal vesicle involvement: Absent.

Neurovascular bundle involvement: Absent.

Pelvic adenopathy: Absent.

Bone metastasis: Absent.

Other findings: Thick-walled, trabeculated bladder. Small fat
containing right inguinal hernia.
IMPRESSION: No findings suspicious for high-grade macroscopic prostate cancer on
MR. BSMT 1.

## 2019-08-17 MED ORDER — GADOBENATE DIMEGLUMINE 529 MG/ML IV SOLN
17.0000 mL | Freq: Once | INTRAVENOUS | Status: AC | PRN
  Administered 2019-08-17: 10:00:00 17 mL via INTRAVENOUS

## 2020-06-24 ENCOUNTER — Other Ambulatory Visit: Payer: Self-pay

## 2020-06-24 ENCOUNTER — Other Ambulatory Visit: Payer: Self-pay | Admitting: Internal Medicine

## 2020-06-24 ENCOUNTER — Ambulatory Visit
Admission: RE | Admit: 2020-06-24 | Discharge: 2020-06-24 | Disposition: A | Discharge status: Home / Self Care | Payer: Medicare Other | Source: Ambulatory Visit | Attending: Internal Medicine | Admitting: Internal Medicine

## 2020-06-24 DIAGNOSIS — R059 Cough, unspecified: Secondary | ICD-10-CM

## 2020-06-24 IMAGING — CR DG CHEST 2V
2 series · 2 of 2 positions shown · non-contrast
Comparison: None

CLINICAL DATA: Cough 6 months

EXAM:
CHEST - 2 VIEW

[w chest pa]
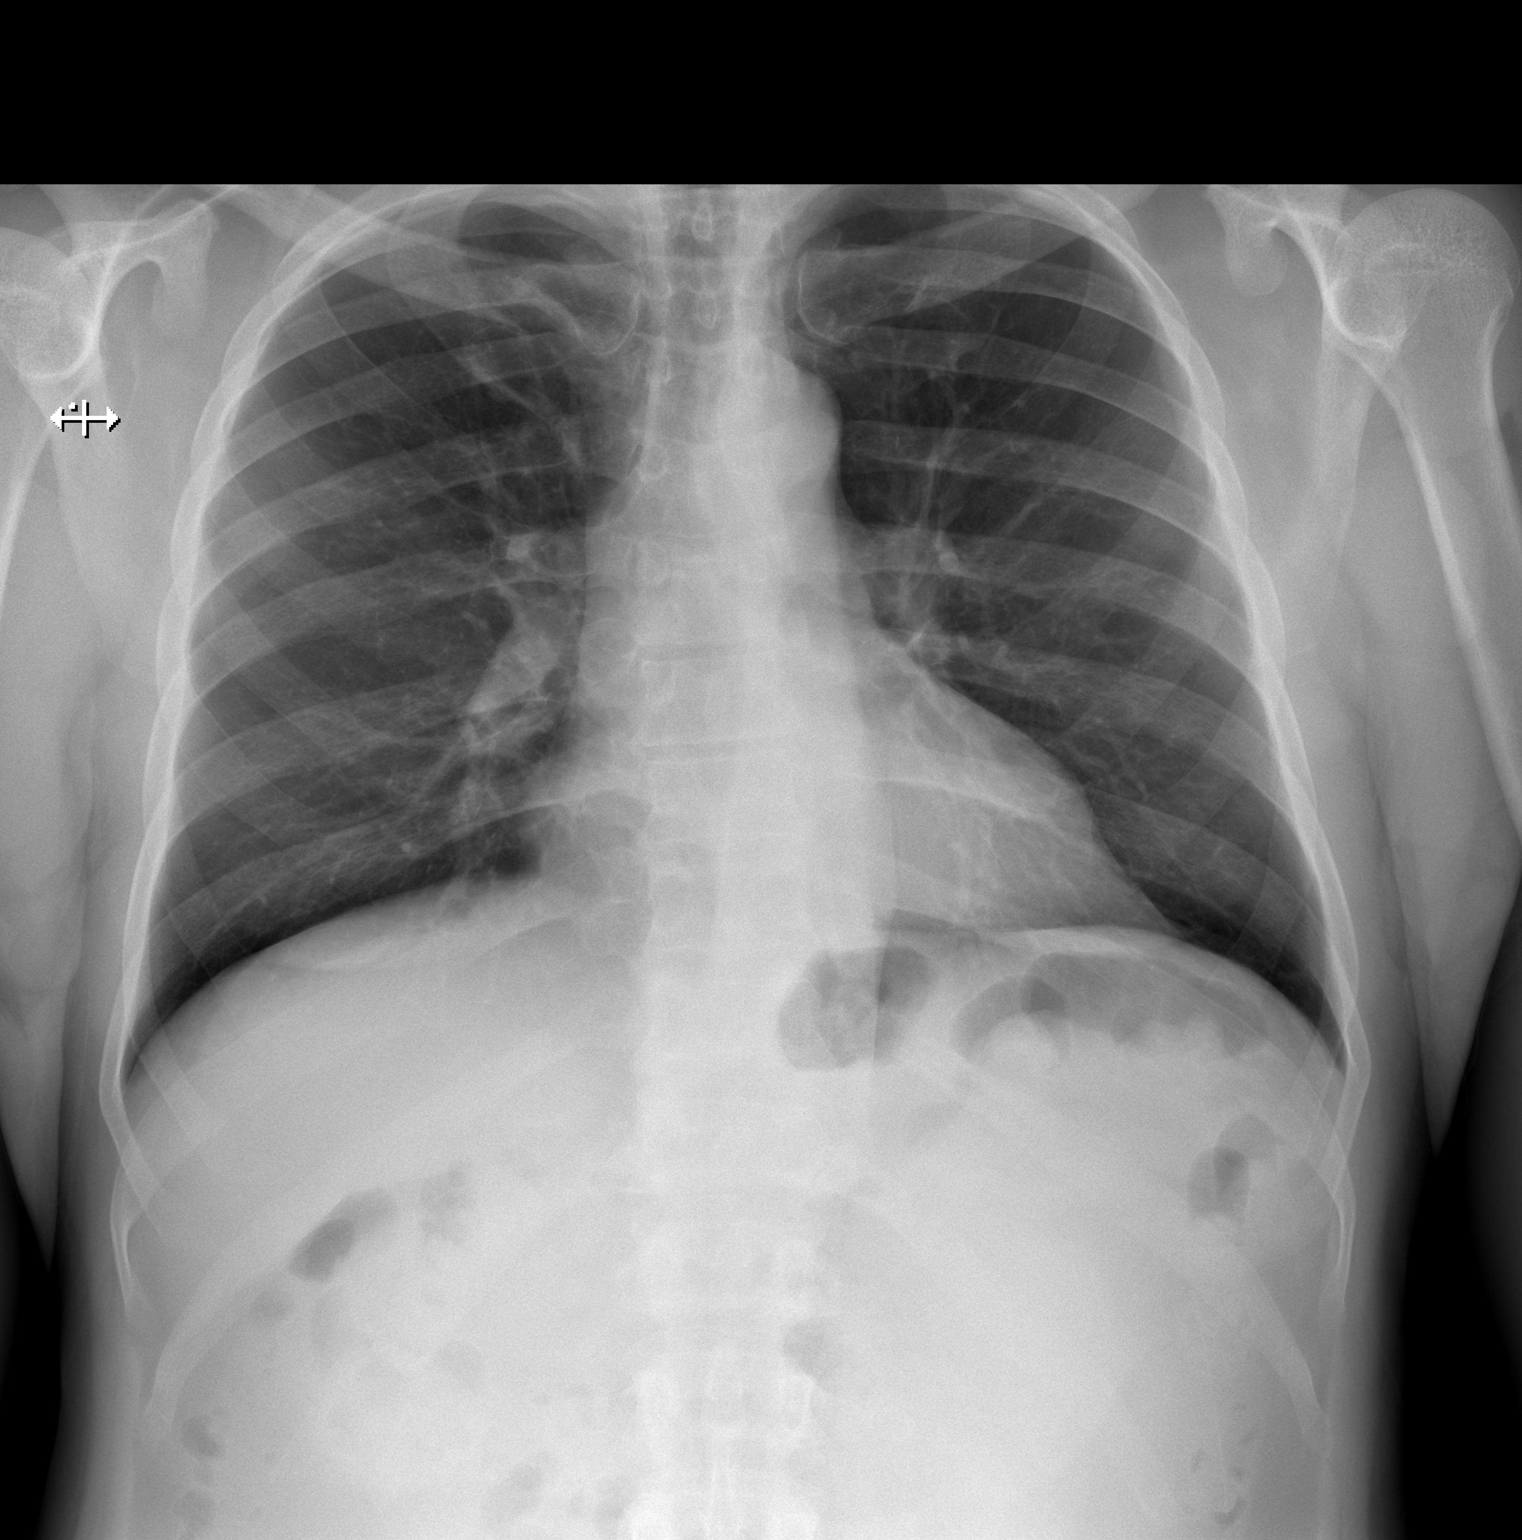

[w chest lat]
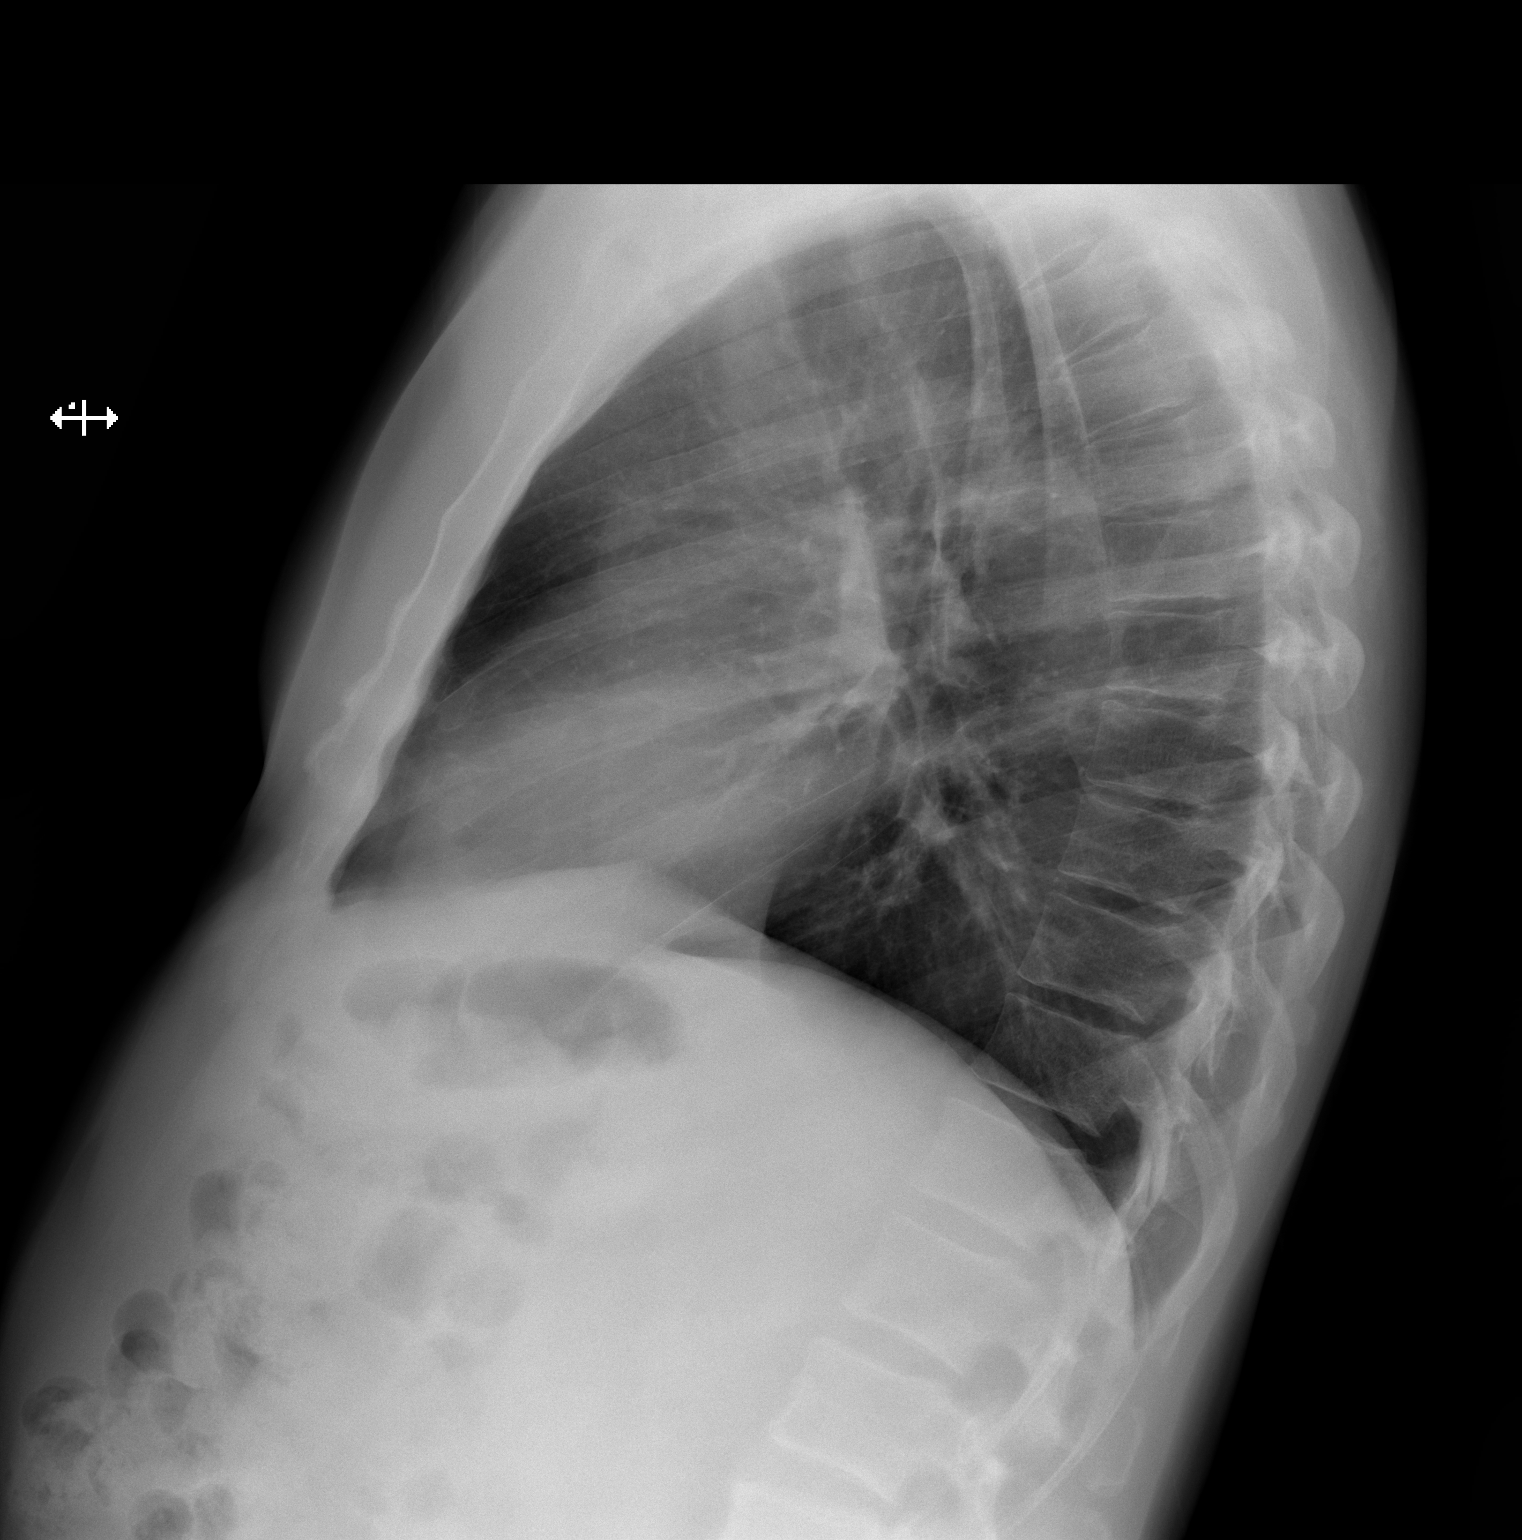

[2 of 2 positions shown; findings below may reference images not displayed]

FINDINGS: The heart size and mediastinal contours are within normal limits.
Both lungs are clear. The visualized skeletal structures are
unremarkable.
IMPRESSION: No active cardiopulmonary disease.

## 2020-11-10 ENCOUNTER — Other Ambulatory Visit: Payer: Self-pay

## 2020-11-10 ENCOUNTER — Encounter: Payer: Self-pay | Admitting: Radiation Oncology

## 2020-11-10 ENCOUNTER — Ambulatory Visit
Admission: RE | Admit: 2020-11-10 | Discharge: 2020-11-10 | Disposition: A | Discharge status: Home / Self Care | Payer: Medicare Other | Source: Ambulatory Visit | Attending: Radiation Oncology | Admitting: Radiation Oncology

## 2020-11-10 ENCOUNTER — Encounter: Payer: Self-pay | Admitting: Medical Oncology

## 2020-11-10 VITALS — BP 140/72 | HR 73 | Temp 97.9°F | Resp 18 | Ht 70.5 in | Wt 190.8 lb

## 2020-11-10 DIAGNOSIS — K0530 Chronic periodontitis, unspecified: Secondary | ICD-10-CM | POA: Insufficient documentation

## 2020-11-10 DIAGNOSIS — E663 Overweight: Secondary | ICD-10-CM | POA: Insufficient documentation

## 2020-11-10 DIAGNOSIS — E039 Hypothyroidism, unspecified: Secondary | ICD-10-CM | POA: Insufficient documentation

## 2020-11-10 DIAGNOSIS — H527 Unspecified disorder of refraction: Secondary | ICD-10-CM | POA: Insufficient documentation

## 2020-11-10 DIAGNOSIS — M179 Osteoarthritis of knee, unspecified: Secondary | ICD-10-CM | POA: Insufficient documentation

## 2020-11-10 DIAGNOSIS — J309 Allergic rhinitis, unspecified: Secondary | ICD-10-CM | POA: Insufficient documentation

## 2020-11-10 DIAGNOSIS — IMO0002 Reserved for concepts with insufficient information to code with codable children: Secondary | ICD-10-CM | POA: Insufficient documentation

## 2020-11-10 DIAGNOSIS — R1310 Dysphagia, unspecified: Secondary | ICD-10-CM | POA: Insufficient documentation

## 2020-11-10 DIAGNOSIS — Z791 Long term (current) use of non-steroidal anti-inflammatories (NSAID): Secondary | ICD-10-CM | POA: Diagnosis not present

## 2020-11-10 DIAGNOSIS — C61 Malignant neoplasm of prostate: Secondary | ICD-10-CM | POA: Insufficient documentation

## 2020-11-10 DIAGNOSIS — Z79899 Other long term (current) drug therapy: Secondary | ICD-10-CM | POA: Insufficient documentation

## 2020-11-10 DIAGNOSIS — B360 Pityriasis versicolor: Secondary | ICD-10-CM | POA: Insufficient documentation

## 2020-11-10 DIAGNOSIS — K5900 Constipation, unspecified: Secondary | ICD-10-CM | POA: Insufficient documentation

## 2020-11-10 DIAGNOSIS — Z1211 Encounter for screening for malignant neoplasm of colon: Secondary | ICD-10-CM | POA: Insufficient documentation

## 2020-11-10 DIAGNOSIS — Z7982 Long term (current) use of aspirin: Secondary | ICD-10-CM | POA: Insufficient documentation

## 2020-11-10 DIAGNOSIS — L820 Inflamed seborrheic keratosis: Secondary | ICD-10-CM | POA: Insufficient documentation

## 2020-11-10 DIAGNOSIS — E237 Disorder of pituitary gland, unspecified: Secondary | ICD-10-CM | POA: Insufficient documentation

## 2020-11-10 DIAGNOSIS — N189 Chronic kidney disease, unspecified: Secondary | ICD-10-CM | POA: Insufficient documentation

## 2020-11-10 DIAGNOSIS — E785 Hyperlipidemia, unspecified: Secondary | ICD-10-CM | POA: Insufficient documentation

## 2020-11-10 DIAGNOSIS — M1710 Unilateral primary osteoarthritis, unspecified knee: Secondary | ICD-10-CM | POA: Insufficient documentation

## 2020-11-10 DIAGNOSIS — I129 Hypertensive chronic kidney disease with stage 1 through stage 4 chronic kidney disease, or unspecified chronic kidney disease: Secondary | ICD-10-CM | POA: Insufficient documentation

## 2020-11-10 DIAGNOSIS — R1032 Left lower quadrant pain: Secondary | ICD-10-CM | POA: Insufficient documentation

## 2020-11-10 HISTORY — DX: Malignant neoplasm of prostate: C61

## 2020-11-10 NOTE — Progress Notes (Signed)
GU Location of Tumor / Histology: prostatic adenocarcinoma  If Prostate Cancer, Gleason Score is (3 + 3) and PSA is (4.56). Prostate volume: 46.4 grams  Alean Rinne with prior history of BPH with LUTS.  10/20/15 Prostate biopsy Atypia and HPIN 11/09/17  Prostate biopsy Atypia and HPIN  Biopsies of prostate (if applicable) revealed:    Past/Anticipated interventions by urology, if any: prostate biopsy x3, prescribed tamsulosin (reports this is helpful), referral to Dr. Alinda Money to discuss surgical options (saw this morning), referral to Dr. Tammi Klippel to discuss radiation options  Past/Anticipated interventions by medical oncology, if any: no  Weight changes, if any: denies  Bowel/Bladder complaints, if any:  IPSS 4. SHIM 23. Denies dysuria, hematuria, or urinary leakage or incontinence. Denies any bowel complaints.   Nausea/Vomiting, if any: denies  Pain issues, if any:  denies  SAFETY ISSUES:  Prior radiation? 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 denies  Possible current pregnancy? no, male patient  Is the patient on methotrexate? no  Current Complaints / other details:  70 year old male. Married. Retired Animator. Father - prostate cancer. Resides in [place].

## 2020-11-10 NOTE — Progress Notes (Signed)
Radiation Oncology         [phone] ________________________________  Initial outpatient Consultation  Name: Frederick Beard MRN: [id number]  Date: 11/10/2020  DOB: 1951-05-27  PI:RJJOACZ, Frederick Millin, MD  Frederick Beard*   REFERRING PHYSICIAN: Davis Beard*  DIAGNOSIS: 70 y.o. gentleman with stage T1c adenocarcinoma of the prostate with a Gleason's score of 3+3 and a PSA of 4.56    ICD-10-CM   1. Malignant neoplasm of prostate ([name])  [name] is a 70 y.o. gentleman with a personal history of BPH and LUTS as well as HGPIN on previous prostate biopsies in 2017 with Dr. Gaynelle Beard and 2019 with Dr. Lovena Beard.  He was noted to have an elevated PSA of 4.56 in March 2022.  Prostate MRI on 08/16/20 showed no suspicious lesions, PI-RADS 1.  The patient proceeded to transrectal ultrasound with 12 biopsies of the prostate on 10/15/20.  The prostate volume measured 46.4 cc.  Out of 12 core biopsies, 2 were positive.  The maximum Gleason score was 3+3, and this was seen in the right lateral base and left mid gland.    The patient reviewed the biopsy results with his urologist and he has kindly been referred today for discussion of potential radiation treatment options.  PREVIOUS RADIATION THERAPY: No  PAST MEDICAL HISTORY:  has a past medical history of Prostate cancer ([place]).    PAST SURGICAL HISTORY: Past Surgical History:  Procedure Laterality Date  . PROSTATE BIOPSY      FAMILY HISTORY: family history includes Prostate cancer in his brother and brother.  SOCIAL HISTORY:  reports that he has never smoked. He has never used smokeless tobacco. He reports previous alcohol use. He reports that he does not use drugs.  ALLERGIES: Patient has no known allergies.  MEDICATIONS:  Current Outpatient Medications  Medication Sig Dispense Refill  . Acetylcysteine 600 MG CAPS acetylcysteine 600 mg capsule  Take 1 capsule every day by oral  route.    Marland Kitchen amLODipine (NORVASC) 5 MG tablet Take 1 tablet by mouth daily.    Marland Kitchen aspirin EC 81 MG tablet Take 81 mg by mouth daily. Swallow whole.    . B Complex-Biotin-FA (B COMPLETE PO) B Complete  Take 1 capsule daily    . Cholecalciferol (VITAMIN D3) 1.25 MG (50000 UT) CAPS Vitamin D-3 5000 units  Take 1 capsule daily    . Coenzyme Q10 (CO Q-10) 100 MG CAPS     . FeFum-FePoly-FA-B Cmp-C-Biot (INTEGRA PLUS PO) Integra Plus 125 mg iron-1 mg capsule  TAKE ONE CAPSULE BY MOUTH EVERY DAY    . hydrocortisone (CORTEF) 10 MG tablet 20 mg daily    . latanoprost (XALATAN) 0.005 % ophthalmic solution 1 drop at bedtime.    . meloxicam (MOBIC) 15 MG tablet Take 15 mg by mouth daily.    . Misc Natural Products (PROSTATE SUPPORT PO) Take by mouth.    . NON FORMULARY L-Thyroxine 100 mcg daily    . Omega-3 Fatty Acids (FISH OIL) 1000 MG CAPS Take by mouth.    . Probiotic Product (PROBIOTIC-10 PO) Probiotic  1 capsule orally once a day    . SIMVASTATIN PO     . tadalafil (CIALIS) 5 MG tablet Take 5 mg by mouth daily.    . tamsulosin (FLOMAX) 0.4 MG CAPS capsule Take 0.8 mg by mouth daily.    . Testosterone 10 MG/ACT (2%) GEL     . furosemide (LASIX) 20 MG tablet  furosemide 20 mg tablet  TAKE 1 TO 2 TABLETS BY MOUTH EVERY DAY AS NEEDED FOR SWELLING (Patient not taking: Reported on 11/10/2020)     No current facility-administered medications for this encounter.    REVIEW OF SYSTEMS:  A 15 point review of systems is documented in the electronic medical record. This was obtained by the nursing staff. However, I reviewed this with the patient to discuss relevant findings and make appropriate changes.  A comprehensive review of systems was negative..  The patient completed an IPSS and IIEF questionnaire.  His IPSS score was 4 indicating mild urinary outflow obstructive symptoms.  He indicated that his erectile function is able to complete sexual activity on most attempts.   PHYSICAL EXAM: This patient is in  no acute distress.  He is alert and oriented.   height is 5' 10.5" (1.791 m) and weight is 190 lb 12.8 oz (86.5 kg). His temperature is 97.9 F (36.6 C). His blood pressure is 140/72 and his pulse is 73. His respiration is 18 and oxygen saturation is 98%.  He exhibits no respiratory distress or labored breathing.  He appears neurologically intact.  His mood is pleasant.  His affect is appropriate.  Please note the digital rectal exam findings described above.  KPS = 100  100 - Normal; no complaints; no evidence of disease. 90   - Able to carry on normal activity; minor signs or symptoms of disease. 80   - Normal activity with effort; some signs or symptoms of disease. 53   - Cares for self; unable to carry on normal activity or to do active work. 60   - Requires occasional assistance, but is able to care for most of his personal needs. 50   - Requires considerable assistance and frequent medical care. 21   - Disabled; requires special care and assistance. 24   - Severely disabled; hospital admission is indicated although death not imminent. 14   - Very sick; hospital admission necessary; active supportive treatment necessary. 10   - Moribund; fatal processes progressing rapidly. 0     - Dead  Karnofsky DA, Abelmann WH, Craver LS and Burchenal JH [phone]) The use of the nitrogen mustards in the palliative treatment of carcinoma: with particular reference to bronchogenic carcinoma Cancer 1 634-56   LABORATORY DATA:  No results found for: WBC, HGB, HCT, MCV, PLT No results found for: NA, K, CL, CO2 No results found for: ALT, AST, GGT, ALKPHOS, BILITOT   RADIOGRAPHY: No results found.    IMPRESSION: This gentleman is a 70 y.o. gentleman with stage T1c adenocarcinoma of the prostate with a Gleason's score of 3+3 and a PSA of 4.56.  His T-Stage, Gleason's Score, and PSA put him into the Favorable risk group.  Accordingly he is eligible for a variety of potential treatment options including active  surveillance, prostatectomy, seed implant or IMRT.  PLAN:Today I reviewed the findings and workup thus far.  We discussed the natural history of prostate cancer.  We reviewed the the implications of T-stage, Gleason's Score, and PSA on decision-making and outcomes in prostate cancer.  We discussed radiation treatment in the management of prostate cancer with regard to the logistics and delivery of external beam radiation treatment as well as the logistics and delivery of prostate brachytherapy.  We compared and contrasted each of these approaches and also compared these against prostatectomy.  We also discussed active surveillance in detail.  The patient would like to proceed with active surveillance, and may  be interested in a seed implant later if any progressio and still eligilble.  I will share my findings with Dr. Lovena Beard.  I enjoyed meeting with him today, and will look forward to participating in the care of this very nice gentleman.   I personally spent 60 minutes in this encounter including chart review, reviewing radiological studies, meeting face-to-face with the patient, entering orders and completing documentation.    ------------------------------------------------  Sheral Apley. Tammi Klippel, M.D.

## 2020-11-12 ENCOUNTER — Encounter: Payer: Self-pay | Admitting: Medical Oncology

## 2020-11-12 NOTE — Progress Notes (Signed)
Pt is going to continue active surveillance for his prostate cancer at this time.

## 2021-11-02 ENCOUNTER — Other Ambulatory Visit: Payer: Self-pay | Admitting: Urology

## 2021-11-02 DIAGNOSIS — C61 Malignant neoplasm of prostate: Secondary | ICD-10-CM

## 2021-11-22 ENCOUNTER — Ambulatory Visit
Admission: RE | Admit: 2021-11-22 | Discharge: 2021-11-22 | Disposition: A | Discharge status: Home / Self Care | Payer: Medicare Other | Source: Ambulatory Visit | Attending: Urology | Admitting: Urology

## 2021-11-22 DIAGNOSIS — C61 Malignant neoplasm of prostate: Secondary | ICD-10-CM

## 2021-11-22 IMAGING — MR MR PROSTATE WO/W CM
12 series · 48 of 48 positions shown · IV contrast (multihance)
Comparison: None Available.

CLINICAL DATA: Elevated PSA equal 4.29. Prostate biopsy [DATE].
Gleason 3+3=6 in the LEFT mid gland.

EXAM:
MR PROSTATE WITHOUT AND WITH CONTRAST
TECHNIQUE: Multiplanar multisequence MRI images were obtained of the pelvis
centered about the prostate. Pre and post contrast images were
obtained.
CONTRAST:  18mL MULTIHANCE GADOBENATE DIMEGLUMINE 529 MG/ML IV SOLN

[Series 3: T2 · coronal · 3.0mm · 0.56mm/px · 1 of 23 slices shown (1 of 3)]
[im 1/23]
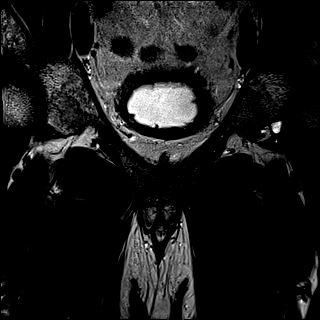

[Series 4: T1 · axial · 5.0mm · 1.25mm/px · 1 of 80 slices shown]
[im 1/80]
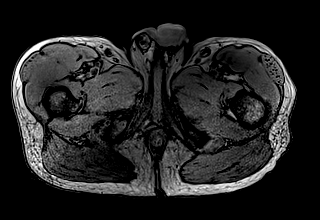

[Series 5: DWI · axial · 3.0mm · 1.75mm/px · z∈[+55,+112]mm · 2 of 60 slices shown (1 of 3)]
[im 1/60]
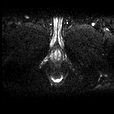
[im 60/60]
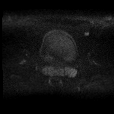

[Series 6: DWI · axial · 3.0mm · 1.75mm/px · 1 of 20 slices shown (2 of 3)]
[im 1/20]
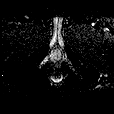

[Series 7: DWI · axial · 3.0mm · 1.75mm/px · 1 of 20 slices shown (3 of 3)]
[im 1/20]
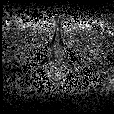

[Series 8: T2 · axial · 3.0mm · 0.56mm/px · 1 of 23 slices shown (2 of 3)]
[im 1/23]
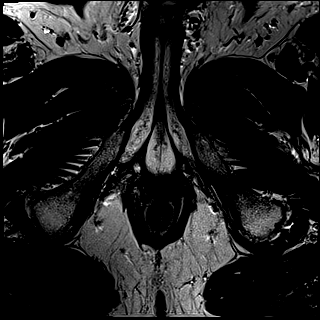

[Series 9: T2 · axial · 1.0mm · 1.04mm/px · z∈[+48,+119]mm · 2 of 72 slices shown (3 of 3)]
[im 1/72]
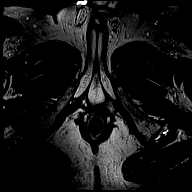
[im 72/72]
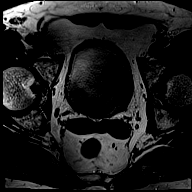

[Series 10: pre t1_twist_tra_dyn · axial · non-contrast · 3.5mm · 0.83mm/px · 1 of 20 slices shown]
[im 1/20]
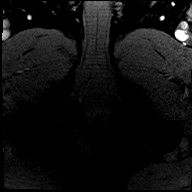

[Series 11: post t1_twist_tra_dyn-copy center · axial · non-contrast · 3.5mm · 0.83mm/px · z∈[+51,+117]mm · 17 of 600 slices shown]
[im 1/600]
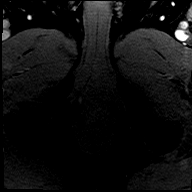
[im 38/600]
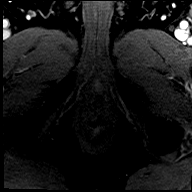
[im 75/600]
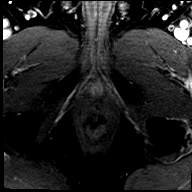
[im 113/600]
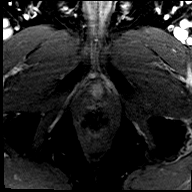
[im 150/600]
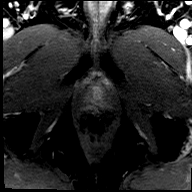
[im 188/600]
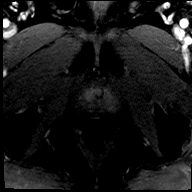
[im 225/600]
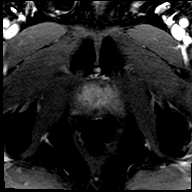
[im 263/600]
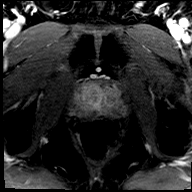
[im 300/600]
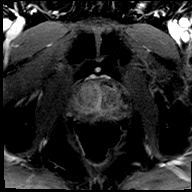
[im 337/600]
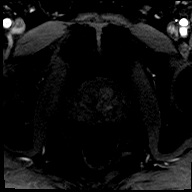
[im 375/600]
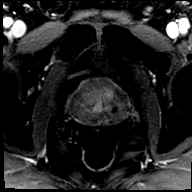
[im 412/600]
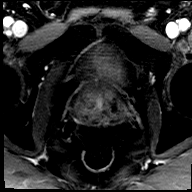
[im 450/600]
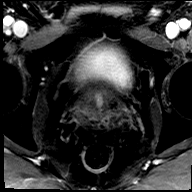
[im 487/600]
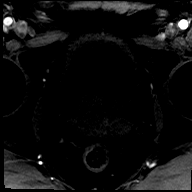
[im 525/600]
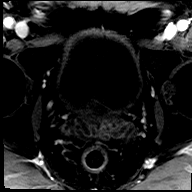
[im 562/600]
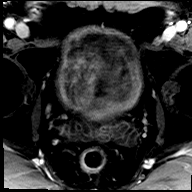
[im 600/600]
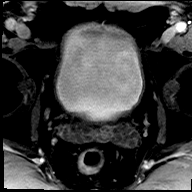

[Series 12: post t1_twist_tra_dyn-copy cent_sub · axial · 3.5mm · 0.83mm/px · z∈[+51,+117]mm · 17 of 580 slices shown]
[im 1/580]
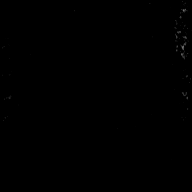
[im 37/580]
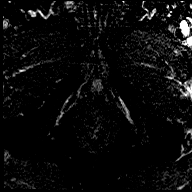
[im 73/580]
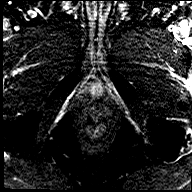
[im 109/580]
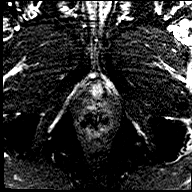
[im 145/580]
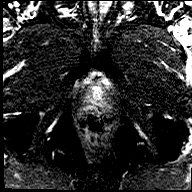
[im 181/580]
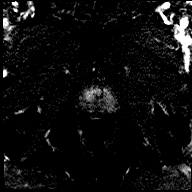
[im 218/580]
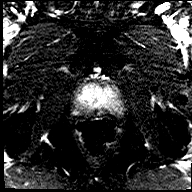
[im 254/580]
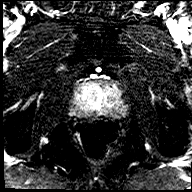
[im 290/580]
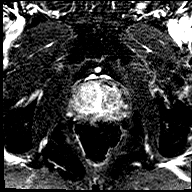
[im 326/580]
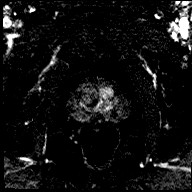
[im 362/580]
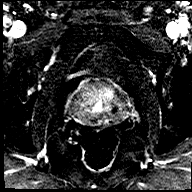
[im 399/580]
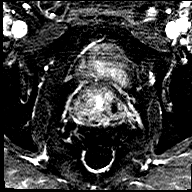
[im 435/580]
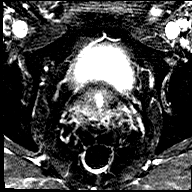
[im 471/580]
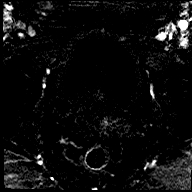
[im 507/580]
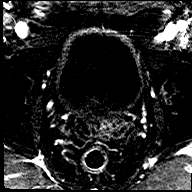
[im 543/580]
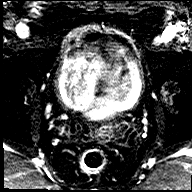
[im 580/580]
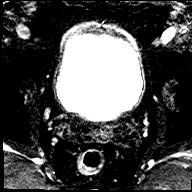

[Series 13: t1_vibe_dixon_tra_f · axial · 2.5mm · 0.91mm/px · z∈[+41,+238]mm · 2 of 80 slices shown]
[im 1/80]
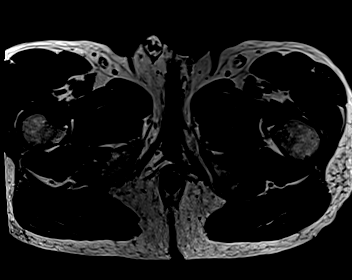
[im 80/80]
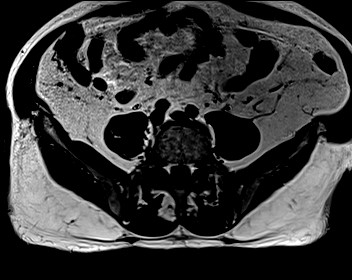

[Series 14: t1_vibe_dixon_tra_w · axial · 2.5mm · 0.91mm/px · z∈[+41,+238]mm · 2 of 80 slices shown]
[im 1/80]
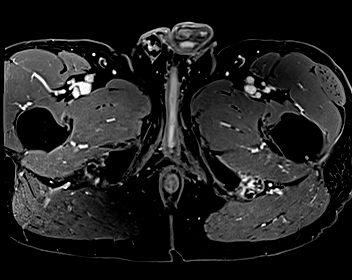
[im 80/80]
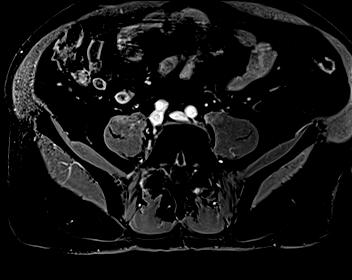

[48 of 48 positions shown; findings below may reference images not displayed]

FINDINGS: Prostate:

Peripheral zone: In the posterior midline peripheral zone there are
several extruded nodules from the transitional zone (image 10/series
8). Benign finding.

There is a single focus of low signal intensity on T2 weighted
imaging which is very small measuring 2 mm (image [DATE]). There is
potential corresponding focus of restricted diffusion (image [DATE]);
however. The small size makes analysis on the diffusion-weighted
imaging difficult. there are several similar scattered low signal
intensity regions throughout the imaging.

Prostatic capsule is intact.  Seminal vesicles normal.

Transitional zone: The transitional zone is enlarged by capsulated
nodules without suspicious imaging characteristics on T2 weighted
imaging.

Volume: 5.6 x 3.6 x 4.1 cm (volume = 43 cm^3)

Transcapsular spread:  Absent

Seminal vesicle involvement: Absent

Neurovascular bundle involvement: Absent

Pelvic adenopathy: Absent

Bone metastasis: Absent

Other findings: None
IMPRESSION: 1. Small lesion in the LEFT lateral mid gland peripheral zone
indeterminate for prostate carcinoma. PI-RADS: 3. Lesion likely too
small for targeting. Recommend attention on follow-up.
2. Enlarged nodular transitional zone most consistent with benign
prostate hypertrophy. PI-RADS: 2

## 2021-11-22 MED ORDER — GADOBENATE DIMEGLUMINE 529 MG/ML IV SOLN
18.0000 mL | Freq: Once | INTRAVENOUS | Status: AC | PRN
  Administered 2021-11-22: 18 mL via INTRAVENOUS

## 2022-02-17 ENCOUNTER — Other Ambulatory Visit: Payer: Self-pay | Admitting: Internal Medicine

## 2022-02-17 DIAGNOSIS — D352 Benign neoplasm of pituitary gland: Secondary | ICD-10-CM

## 2022-03-07 ENCOUNTER — Ambulatory Visit
Admission: RE | Admit: 2022-03-07 | Discharge: 2022-03-07 | Disposition: A | Discharge status: Home / Self Care | Payer: Medicare Other | Source: Ambulatory Visit | Attending: Internal Medicine | Admitting: Internal Medicine

## 2022-03-07 DIAGNOSIS — D352 Benign neoplasm of pituitary gland: Secondary | ICD-10-CM

## 2022-03-07 MED ORDER — GADOPICLENOL 0.5 MMOL/ML IV SOLN
10.0000 mL | Freq: Once | INTRAVENOUS | Status: AC | PRN
  Administered 2022-03-07: 10 mL via INTRAVENOUS

## 2022-11-16 ENCOUNTER — Other Ambulatory Visit: Payer: Self-pay | Admitting: Urology

## 2022-11-16 DIAGNOSIS — C61 Malignant neoplasm of prostate: Secondary | ICD-10-CM

## 2023-01-03 ENCOUNTER — Other Ambulatory Visit: Payer: Medicare Other

## 2023-01-24 ENCOUNTER — Ambulatory Visit: Admission: RE | Admit: 2023-01-24 | Payer: Medicare Other | Source: Ambulatory Visit

## 2023-01-24 DIAGNOSIS — C61 Malignant neoplasm of prostate: Secondary | ICD-10-CM

## 2023-01-24 MED ORDER — GADOPICLENOL 0.5 MMOL/ML IV SOLN
9.0000 mL | Freq: Once | INTRAVENOUS | Status: AC | PRN
  Administered 2023-01-24: 9 mL via INTRAVENOUS

## 2023-03-01 ENCOUNTER — Other Ambulatory Visit: Payer: Self-pay | Admitting: Internal Medicine

## 2023-03-01 ENCOUNTER — Ambulatory Visit
Admission: RE | Admit: 2023-03-01 | Discharge: 2023-03-01 | Disposition: A | Discharge status: Home / Self Care | Payer: Medicare Other | Source: Ambulatory Visit | Attending: Internal Medicine | Admitting: Internal Medicine

## 2023-03-01 DIAGNOSIS — M545 Low back pain, unspecified: Secondary | ICD-10-CM

## 2023-03-17 ENCOUNTER — Telehealth: Payer: Self-pay | Admitting: Radiation Oncology

## 2023-03-17 NOTE — Telephone Encounter (Signed)
LVM on mobile number to schedule CON with Dr. Kathrynn Running. Spoke with pt's wife on home number who was not able to take a message and indicated best way to contact pt is via mobile number.

## 2023-03-23 ENCOUNTER — Ambulatory Visit
Admission: RE | Admit: 2023-03-23 | Discharge: 2023-03-23 | Disposition: A | Discharge status: Home / Self Care | Payer: Medicare Other | Source: Ambulatory Visit | Attending: Urology | Admitting: Urology

## 2023-03-23 ENCOUNTER — Ambulatory Visit
Admission: RE | Admit: 2023-03-23 | Discharge: 2023-03-23 | Disposition: A | Discharge status: Home / Self Care | Payer: Medicare Other | Source: Ambulatory Visit | Attending: Radiation Oncology | Admitting: Radiation Oncology

## 2023-03-23 ENCOUNTER — Encounter: Payer: Self-pay | Admitting: Urology

## 2023-03-23 VITALS — BP 138/74 | HR 68 | Temp 97.7°F | Resp 20 | Ht 70.5 in | Wt 187.6 lb

## 2023-03-23 DIAGNOSIS — Z8042 Family history of malignant neoplasm of prostate: Secondary | ICD-10-CM | POA: Insufficient documentation

## 2023-03-23 DIAGNOSIS — Z923 Personal history of irradiation: Secondary | ICD-10-CM | POA: Diagnosis not present

## 2023-03-23 DIAGNOSIS — Z79899 Other long term (current) drug therapy: Secondary | ICD-10-CM | POA: Insufficient documentation

## 2023-03-23 DIAGNOSIS — C61 Malignant neoplasm of prostate: Secondary | ICD-10-CM | POA: Diagnosis present

## 2023-03-23 DIAGNOSIS — Z791 Long term (current) use of non-steroidal anti-inflammatories (NSAID): Secondary | ICD-10-CM | POA: Insufficient documentation

## 2023-03-23 DIAGNOSIS — Z7982 Long term (current) use of aspirin: Secondary | ICD-10-CM | POA: Diagnosis not present

## 2023-03-23 NOTE — Progress Notes (Signed)
Radiation Oncology         [phone]) [phone] ________________________________  Outpatient Re-Consult  Name: Frederick Beard MRN: [id number]  Date of Service: 03/23/2023 DOB: 1950-11-09  VH:QIONGEX, Cala Bradford, MD  Shelly Rubenstein*   REFERRING PHYSICIAN: Shelly Rubenstein*  DIAGNOSIS: 72 year old man with T1c, Gleason 3+4, adenocarcinoma of the prostate with a PSA of 5.33.    ICD-10-CM   1. Malignant neoplasm of prostate ([facility])  C61       HISTORY OF PRESENT ILLNESS: Frederick Beard is a 72 y.o. male seen at the request of Dr. Liliane Shi.  We initially met the patient back in June 2022 to discuss her his newly diagnosed Gleason 3+3 prostate cancer with PSA of 4.56 at diagnosis.  After considering all of his options he elected to proceed with active surveillance and has continued in close follow-up under the care and direction of Dr. Liliane Shi.  Fluctuated between 3.5-4.5 but overall remained stable.  A surveillance prostate MRI was performed 11/22/2021 and showed a small, PI-RADS 3 lesion in the left lateral mid gland peripheral zone, is too small for targeting.  He had a surveillance TRUSPBx on 01/13/2022 which showed stable Gleason 3+3 only so he elected to continue in active surveillance.  His PSA was 4.93 in February 2024 and 5.33 in September 2024.  He had a repeat surveillance MRI of the prostate on 01/24/2023 which showed 2 small polyps peripheral zone.  He proceeded to repeat surveillance biopsy with MRI fusion on 03/09/2023 which showed 5 of 18 cores positive.  The prostate volume was 41 g.  The highest Gleason score was 3+4 and this was seen in the right base lateral, right apex lateral, right apex and left mid lateral.  Additionally, there was Gleason 3+3 in the left apex.  Given the evidence of disease progression from Gleason 6 to now, favorable intermediate risk than 7 disease, he has kindly been referred back to [place] today to discuss the radiation treatment options.  He is also planning to  meet back with Dr. Laverle Patter on 04/07/2023 to revisit his surgical options prior to committing to treatment.  PREVIOUS RADIATION THERAPY: No  PAST MEDICAL HISTORY:  Past Medical History:  Diagnosis Date   Prostate cancer (HCC)       PAST SURGICAL HISTORY: Past Surgical History:  Procedure Laterality Date   PROSTATE BIOPSY      FAMILY HISTORY:  Family History  Problem Relation Age of Onset   Prostate cancer Brother    Prostate cancer Brother    Breast cancer Neg Hx    Colon cancer Neg Hx    Pancreatic cancer Neg Hx     SOCIAL HISTORY:  Social History   Socioeconomic History   Marital status: Married    Spouse name: Not on file   Number of children: Not on file   Years of education: Not on file   Highest education level: Not on file  Occupational History   Occupation: Production designer, theatre/television/film    Comment: retired  Tobacco Use   Smoking status: Never   Smokeless tobacco: Never  Vaping Use   Vaping status: Never Used  Substance and Sexual Activity   Alcohol use: Not Currently   Drug use: Never   Sexual activity: Yes  Other Topics Concern   Not on file  Social History Narrative   Not on file   Social Determinants of Health   Financial Resource Strain: Not on file  Food Insecurity: No Food Insecurity (03/23/2023)   Hunger Vital Sign  Worried About Programme researcher, broadcasting/film/video in the Last Year: Never true    Ran Out of Food in the Last Year: Never true  Transportation Needs: No Transportation Needs (03/23/2023)   PRAPARE - Administrator, Civil Service (Medical): No    Lack of Transportation (Non-Medical): No  Physical Activity: Not on file  Stress: Not on file  Social Connections: Not on file  Intimate Partner Violence: Not At Risk (03/23/2023)   Humiliation, Afraid, Rape, and Kick questionnaire    Fear of Current or Ex-Partner: No    Emotionally Abused: No    Physically Abused: No    Sexually Abused: No    ALLERGIES: Patient has no known  allergies.  MEDICATIONS:  Current Outpatient Medications  Medication Sig Dispense Refill   Acetylcysteine 600 MG CAPS acetylcysteine 600 mg capsule  Take 1 capsule every day by oral route.     amLODipine (NORVASC) 5 MG tablet Take 1 tablet by mouth daily.     aspirin EC 81 MG tablet Take 81 mg by mouth daily. Swallow whole.     B Complex-Biotin-FA (B COMPLETE PO) B Complete  Take 1 capsule daily     Cholecalciferol (VITAMIN D3) 1.25 MG (50000 UT) CAPS Vitamin D-3 5000 units  Take 1 capsule daily     Coenzyme Q10 (CO Q-10) 100 MG CAPS      FeFum-FePoly-FA-B Cmp-C-Biot (INTEGRA PLUS PO) Integra Plus 125 mg iron-1 mg capsule  TAKE ONE CAPSULE BY MOUTH EVERY DAY     furosemide (LASIX) 20 MG tablet furosemide 20 mg tablet  TAKE 1 TO 2 TABLETS BY MOUTH EVERY DAY AS NEEDED FOR SWELLING (Patient not taking: Reported on 11/10/2020)     hydrocortisone (CORTEF) 10 MG tablet 20 mg daily     latanoprost (XALATAN) 0.005 % ophthalmic solution 1 drop at bedtime.     meloxicam (MOBIC) 15 MG tablet Take 15 mg by mouth daily.     Misc Natural Products (PROSTATE SUPPORT PO) Take by mouth.     NON FORMULARY L-Thyroxine 100 mcg daily     Omega-3 Fatty Acids (FISH OIL) 1000 MG CAPS Take by mouth.     Probiotic Product (PROBIOTIC-10 PO) Probiotic  1 capsule orally once a day     SIMVASTATIN PO      tadalafil (CIALIS) 5 MG tablet Take 5 mg by mouth daily.     tamsulosin (FLOMAX) 0.4 MG CAPS capsule Take 0.8 mg by mouth daily.     Testosterone 10 MG/ACT (2%) GEL      No current facility-administered medications for this encounter.    REVIEW OF SYSTEMS:  On review of systems, the patient reports that he is doing well overall.  He denies any chest pain, shortness of breath, cough, fevers, chills, night sweats, unintended weight changes.  His IPSS score was 10, indicating moderate urinary symptoms his SHIM score was 25, indicating his erectile dysfunction is well-managed with Cialis daily.  He denies any bowel or  bladder disturbances, and denies abdominal pain, nausea or vomiting.  He denies any new musculoskeletal or joint aches or pains. A complete review of systems is obtained and is otherwise negative.    PHYSICAL EXAM:  Wt Readings from Last 3 Encounters:  03/23/23 187 lb 9.6 oz (85.1 kg)  11/10/20 190 lb 12.8 oz (86.5 kg)  03/03/14 183 lb (83 kg)   Temp Readings from Last 3 Encounters:  03/23/23 97.7 F (36.5 C) (Temporal)  11/10/20 97.9 F (36.6 C)  BP Readings from Last 3 Encounters:  03/23/23 138/74  11/10/20 140/72   Pulse Readings from Last 3 Encounters:  03/23/23 68  11/10/20 73   Pain Assessment Pain Score: 0-No pain/10  In general this is a well appearing African-American man in no acute distress.  He's alert and oriented x4 and appropriate throughout the examination. Cardiopulmonary assessment is negative for acute distress and he exhibits normal effort.   KPS = 100  100 - Normal; no complaints; no evidence of disease. 90   - Able to carry on normal activity; minor signs or symptoms of disease. 80   - Normal activity with effort; some signs or symptoms of disease. 56   - Cares for self; unable to carry on normal activity or to do active work. 60   - Requires occasional assistance, but is able to care for most of his personal needs. 50   - Requires considerable assistance and frequent medical care. 40   - Disabled; requires special care and assistance. 30   - Severely disabled; hospital admission is indicated although death not imminent. 20   - Very sick; hospital admission necessary; active supportive treatment necessary. 10   - Moribund; fatal processes progressing rapidly. 0     - Dead  Karnofsky DA, Abelmann WH, Craver LS and Burchenal JH [phone]) The use of the nitrogen mustards in the palliative treatment of carcinoma: with particular reference to bronchogenic carcinoma Cancer 1 634-56  LABORATORY DATA:  No results found for: "WBC", "HGB", "HCT", "MCV",  "PLT" No results found for: "NA", "K", "CL", "CO2" No results found for: "ALT", "AST", "GGT", "ALKPHOS", "BILITOT"   RADIOGRAPHY: DG Lumbar Spine Complete  Result Date: 03/20/2023 CLINICAL DATA:  Back pain. EXAM: LUMBAR SPINE - COMPLETE 5 VIEW COMPARISON:  None Available. FINDINGS: There is no evidence of lumbar spine fracture. Alignment is normal. Osteophyte formation identified L3-4 and L4-5 consistent with mild degenerative disc disease. IMPRESSION: Mild degenerative changes.  No acute osseous abnormalities. Electronically Signed   By: Layla Maw M.D.   On: 03/20/2023 21:57      IMPRESSION/PLAN: 1. 72 y.o. man with T1c, Gleason 3+4, adenocarcinoma of the prostate with a PSA of 5.33.  We discussed the patient's workup and outlined the nature of prostate cancer in this setting. The patient's T stage, Gleason's score, and PSA put him into the favorable intermediate risk group. Accordingly, he is eligible for a variety of potential treatment options including brachytherapy, 5.5 weeks of external radiation or prostatectomy. We reviewed the available radiation techniques, and focused on the details and logistics of delivery. We discussed and outlined the risks, benefits, short and long-term effects associated with radiotherapy and compared and contrasted these with prostatectomy. We discussed the role of SpaceOAR gel in reducing the rectal toxicity associated with radiotherapy. He appears to have a good understanding of his disease and our treatment recommendations which are of curative intent.  He was encouraged to ask questions that were answered to his stated satisfaction.  At the conclusion of our conversation, the patient is leaning towards proceeding with brachytherapy and SpaceOAR gel placement but would like to revisit his surgical options as scheduled with Dr. Laverle Patter on 04/07/2023.  He has 2 twin granddaughters that will be in town for Christmas this year and a 50th wedding anniversary  coming up on February 3rd so he would like to postpone any form of treatment until mid February 2025.  Given his favorable intermediate risk disease we feel like this is reasonable. We  will share our discussion with Dr. Liliane Shi and await the patient's final decision.  If he elects to proceed with brachytherapy, we will move forward with treatment planning at that time.  We enjoyed meeting with him again today look forward to continuing to participate in his care.  He knows that he is welcome to call with any questions or concerns in the interim.  We personally spent 60 minutes in this encounter including chart review, reviewing radiological studies, meeting face-to-face with the patient, entering orders and completing documentation.    Marguarite Arbour, PA-C    Margaretmary Dys, MD  [facility]  Radiation Oncology Direct Dial: [phone]  Fax: [phone] [email or web address]  Skype  LinkedIn

## 2023-03-23 NOTE — Progress Notes (Signed)
Follow-Up-New nursing interview for a diagnosis of Malignant neoplasm of prostate (HCC).  Patient identity verified x2.   Patient reports doing well. Patient denies all other related issues at this time.  Meaningful use complete.  I-PSS score- 10 - Moderate SHIM score- 25 Urinary Management medication(s) Tamsulosin Urology appointment date- Pending with Dr. Liliane Shi at [facility]  Vitals- BP 138/74 (BP Location: Left Arm, Patient Position: Sitting, Cuff Size: Normal)   Pulse 68   Temp 97.7 F (36.5 C) (Temporal)   Resp 20   Ht 5' 10.5" (1.791 m)   Wt 187 lb 9.6 oz (85.1 kg)   SpO2 99%   BMI 26.54 kg/m   This concludes the interaction.  Ruel Favors, LPN

## 2023-04-10 NOTE — Progress Notes (Signed)
Patient was a rad onc consult on 10/17 for his T1c, Gleason 3+4, adenocarcinoma of the prostate with a PSA of 5.33 and was undecided regarding treatment decision.  Patient proceed with surgical consult with Dr. Laverle Patter on 11/1.   RN reached out to patient and patient would like to move forward with brachytherapy in late Feb/early March.    Plan of care in progress.

## 2023-04-11 ENCOUNTER — Telehealth: Payer: Self-pay | Admitting: *Deleted

## 2023-04-11 NOTE — Telephone Encounter (Signed)
CALLED PATIENT TO ASK QUESTIONS, LVM FOR A RETURN CALL 

## 2023-04-12 ENCOUNTER — Telehealth: Payer: Self-pay | Admitting: *Deleted

## 2023-04-12 NOTE — Telephone Encounter (Signed)
RETURNED PATIENT'S PHONE CALL, SPOKE WITH PATIENT. ?

## 2023-04-17 ENCOUNTER — Other Ambulatory Visit: Payer: Self-pay | Admitting: Urology

## 2023-07-12 ENCOUNTER — Telehealth: Payer: Self-pay | Admitting: *Deleted

## 2023-07-12 ENCOUNTER — Encounter: Payer: Self-pay | Admitting: Urology

## 2023-07-12 NOTE — Telephone Encounter (Signed)
 Called patient to remind of pre-seed appts. for 07-13-23, spoke with patient and he is aware of these appts.

## 2023-07-12 NOTE — Progress Notes (Signed)
 Pre-seed nursing interview for a diagnosis of Stage T1c, Gleason 3+4, adenocarcinoma of the prostate with a PSA of 5.33.  Patient identity verified x2.   Patient reports doing well. No issues conveyed at this time.  Meaningful use complete.  Urinary Management medication(s)- Tamsulosin Urology appointment date- 09/11/23, with Dr. Devere at [facility]  No vitals needed for this visit. Resp 19   Ht 5' 10.5 (1.791 m)   Wt 183 lb (83 kg)   BMI 25.89 kg/m   This concludes the interaction.  Rosaline Minerva, LPN

## 2023-07-12 NOTE — Progress Notes (Signed)
  Radiation Oncology         [phone]) [phone] ________________________________  Name: Frederick Beard MRN: [id number]  Date: 07/13/2023  DOB: September 20, 1950  SIMULATION AND TREATMENT PLANNING NOTE PUBIC ARCH STUDY  RR:Dyzounw, Suzen, MD  Devere Lonni Fire*  DIAGNOSIS: 73 year old man with Stage T1c, Gleason 3+4, adenocarcinoma of the prostate with a PSA of 5.33.   Oncology History  Malignant neoplasm of prostate (HCC)  11/10/2020 Initial Diagnosis   Malignant neoplasm of prostate (HCC)   03/09/2023 Cancer Staging   Staging form: Prostate, AJCC 8th Edition - Clinical stage from 03/09/2023: Stage IIB (cT1c, cN0, cM0, PSA: 5.3, Grade Group: 2) - Signed by Sherwood Rise, PA-C on 03/22/2023 Histopathologic type: Adenocarcinoma, NOS Stage prefix: Initial diagnosis Prostate specific antigen (PSA) range: Less than 10 Gleason primary pattern: 3 Gleason secondary pattern: 4 Gleason score: 7 Histologic grading system: 5 grade system Number of biopsy cores examined: 5 Number of biopsy cores positive: 18 Location of positive needle core biopsies: Both sides       ICD-10-CM   1. Malignant neoplasm of prostate (HCC)  C61       COMPLEX SIMULATION:  The patient presented today for evaluation for possible prostate seed implant. He was brought to the radiation planning suite and placed supine on the CT couch. A 3-dimensional image study set was obtained in upload to the planning computer. There, on each axial slice, I contoured the prostate gland. Then, using three-dimensional radiation planning tools I reconstructed the prostate in view of the structures from the transperineal needle pathway to assess for possible pubic arch interference. In doing so, I did not appreciate any pubic arch interference. Also, the patient's prostate volume was estimated based on the drawn structure. The volume was 42 cc.  Given the pubic arch appearance and prostate volume, patient remains a good candidate to proceed with  prostate seed implant. Today, he freely provided informed written consent to proceed.    PLAN: The patient will undergo prostate seed implant.   ________________________________  Donnice LABOR. Patrcia, M.D.

## 2023-07-12 NOTE — Progress Notes (Signed)
 Radiation Oncology         [phone] ________________________________  Outpatient Follow up- Pre-seed visit  Name: Frederick Beard MRN: [id number]  Date: 07/13/2023  DOB: 05-Feb-1951  RR:Dyzounw, Suzen, MD  Devere Lonni Fire*   REFERRING PHYSICIAN: Devere Lonni Fire*  DIAGNOSIS: 73 y.o. gentleman with Stage T1c, Gleason 3+4, adenocarcinoma of the prostate with a PSA of 5.33.     ICD-10-CM   1. Malignant neoplasm of prostate (HCC)  C61       HISTORY OF PRESENT ILLNESS: Frederick Beard is a 73 y.o. male  seen at the request of Dr. Devere.  We initially met the patient back in June 2022 to discuss her his newly diagnosed Gleason 3+3 prostate cancer with PSA of 4.56 at diagnosis.  After considering all of his options he elected to proceed with active surveillance and has continued in close follow-up under the care and direction of Dr. Devere.  Fluctuated between 3.5-4.5 but overall remained stable.  A surveillance prostate MRI was performed 11/22/2021 and showed a small, PI-RADS 3 lesion in the left lateral mid gland peripheral zone, is too small for targeting.  He had a surveillance TRUSPBx on 01/13/2022 which showed stable Gleason 3+3 only so he elected to continue in active surveillance.  His PSA was 4.93 in February 2024 and 5.33 in September 2024.  He had a repeat surveillance MRI of the prostate on 01/24/2023 which showed 2 small polyps peripheral zone.  He proceeded to repeat surveillance biopsy with MRI fusion on 03/09/2023 which showed 5 of 18 cores positive.  The prostate volume was 41 g.  The highest Gleason score was 3+4 and this was seen in the right base lateral, right apex lateral, right apex and left mid lateral.  Additionally, there was Gleason 3+3 in the left apex.   The patient reviewed the biopsy results with his urologist and was kindly referred to us  for discussion of potential radiation treatment options. We initially met the patient on 03/23/23 and he was most  interested in proceeding with brachytherapy and SpaceOAR gel placement for treatment of his disease. He is here today for his pre-procedure imaging for planning and to answer any additional questions he may have about this treatment.   PREVIOUS RADIATION THERAPY: No  PAST MEDICAL HISTORY:  Past Medical History:  Diagnosis Date   Prostate cancer (HCC)       PAST SURGICAL HISTORY: Past Surgical History:  Procedure Laterality Date   PROSTATE BIOPSY      FAMILY HISTORY:  Family History  Problem Relation Age of Onset   Prostate cancer Brother    Prostate cancer Brother    Breast cancer Neg Hx    Colon cancer Neg Hx    Pancreatic cancer Neg Hx     SOCIAL HISTORY:  Social History   Socioeconomic History   Marital status: Married    Spouse name: Not on file   Number of children: Not on file   Years of education: Not on file   Highest education level: Not on file  Occupational History   Occupation: production designer, theatre/television/film    Comment: retired  Tobacco Use   Smoking status: Never   Smokeless tobacco: Never  Vaping Use   Vaping status: Never Used  Substance and Sexual Activity   Alcohol use: Not Currently   Drug use: Never   Sexual activity: Yes  Other Topics Concern   Not on file  Social History Narrative   Not on file   Social Drivers  of Health   Financial Resource Strain: Not on file  Food Insecurity: No Food Insecurity (03/23/2023)   Hunger Vital Sign    Worried About Running Out of Food in the Last Year: Never true    Ran Out of Food in the Last Year: Never true  Transportation Needs: No Transportation Needs (03/23/2023)   PRAPARE - Administrator, Civil Service (Medical): No    Lack of Transportation (Non-Medical): No  Physical Activity: Not on file  Stress: Not on file  Social Connections: Not on file  Intimate Partner Violence: Not At Risk (03/23/2023)   Humiliation, Afraid, Rape, and Kick questionnaire    Fear of Current or Ex-Partner: No     Emotionally Abused: No    Physically Abused: No    Sexually Abused: No    ALLERGIES: Patient has no known allergies.  MEDICATIONS:  Current Outpatient Medications  Medication Sig Dispense Refill   Acetylcysteine 600 MG CAPS acetylcysteine 600 mg capsule  Take 1 capsule every day by oral route.     amLODipine (NORVASC) 5 MG tablet Take 1 tablet by mouth daily.     aspirin EC 81 MG tablet Take 81 mg by mouth daily. Swallow whole.     B Complex-Biotin-FA (B COMPLETE PO) B Complete  Take 1 capsule daily     Cholecalciferol (VITAMIN D3) 1.25 MG (50000 UT) CAPS Vitamin D-3 5000 units  Take 1 capsule daily     Coenzyme Q10 (CO Q-10) 100 MG CAPS      FeFum-FePoly-FA-B Cmp-C-Biot (INTEGRA PLUS PO) Integra Plus 125 mg iron-1 mg capsule  TAKE ONE CAPSULE BY MOUTH EVERY DAY     furosemide (LASIX) 20 MG tablet furosemide 20 mg tablet  TAKE 1 TO 2 TABLETS BY MOUTH EVERY DAY AS NEEDED FOR SWELLING (Patient not taking: Reported on 11/10/2020)     hydrocortisone (CORTEF) 10 MG tablet 20 mg daily     latanoprost (XALATAN) 0.005 % ophthalmic solution 1 drop at bedtime.     meloxicam (MOBIC) 15 MG tablet Take 15 mg by mouth daily.     Misc Natural Products (PROSTATE SUPPORT PO) Take by mouth.     NON FORMULARY L-Thyroxine 100 mcg daily     Omega-3 Fatty Acids (FISH OIL) 1000 MG CAPS Take by mouth.     Probiotic Product (PROBIOTIC-10 PO) Probiotic  1 capsule orally once a day     SIMVASTATIN PO      tadalafil (CIALIS) 5 MG tablet Take 5 mg by mouth daily.     tamsulosin (FLOMAX) 0.4 MG CAPS capsule Take 0.8 mg by mouth daily.     Testosterone 10 MG/ACT (2%) GEL      No current facility-administered medications for this visit.    REVIEW OF SYSTEMS:  On review of systems, the patient reports that he is doing well overall.  He denies any chest pain, shortness of breath, cough, fevers, chills, night sweats, unintended weight changes.  His IPSS score was 10, indicating moderate urinary symptoms his SHIM  score was 25, indicating his erectile dysfunction is well-managed with Cialis daily.  He denies any bowel or bladder disturbances, and denies abdominal pain, nausea or vomiting.  He denies any new musculoskeletal or joint aches or pains. A complete review of systems is obtained and is otherwise negative.     PHYSICAL EXAM:  Wt Readings from Last 3 Encounters:  03/23/23 187 lb 9.6 oz (85.1 kg)  11/10/20 190 lb 12.8 oz (86.5 kg)  03/03/14  183 lb (83 kg)   Temp Readings from Last 3 Encounters:  03/23/23 97.7 F (36.5 C) (Temporal)  11/10/20 97.9 F (36.6 C)   BP Readings from Last 3 Encounters:  03/23/23 138/74  11/10/20 140/72   Pulse Readings from Last 3 Encounters:  03/23/23 68  11/10/20 73    /10  In general this is a well appearing African American male in no acute distress. He's alert and oriented x4 and appropriate throughout the examination. Cardiopulmonary assessment is negative for acute distress, and he exhibits normal effort.     KPS = 100  100 - Normal; no complaints; no evidence of disease. 90   - Able to carry on normal activity; minor signs or symptoms of disease. 80   - Normal activity with effort; some signs or symptoms of disease. 55   - Cares for self; unable to carry on normal activity or to do active work. 60   - Requires occasional assistance, but is able to care for most of his personal needs. 50   - Requires considerable assistance and frequent medical care. 40   - Disabled; requires special care and assistance. 30   - Severely disabled; hospital admission is indicated although death not imminent. 20   - Very sick; hospital admission necessary; active supportive treatment necessary. 10   - Moribund; fatal processes progressing rapidly. 0     - Dead  Karnofsky DA, Abelmann WH, Craver LS and Burchenal JH [phone]) The use of the nitrogen mustards in the palliative treatment of carcinoma: with particular reference to bronchogenic carcinoma Cancer 1  [id number]  LABORATORY DATA:  No results found for: WBC, HGB, HCT, MCV, PLT No results found for: NA, K, CL, CO2 No results found for: ALT, AST, GGT, ALKPHOS, BILITOT   RADIOGRAPHY: No results found.    IMPRESSION/PLAN: 1. 73 y.o. gentleman with Stage T1c, Gleason 3+4, adenocarcinoma of the prostate with a PSA of 5.33.  The patient has elected to proceed with seed implant for treatment of his disease. We reviewed the risks, benefits, short and long-term effects associated with brachytherapy and discussed the role of SpaceOAR in reducing the rectal toxicity associated with radiotherapy.  He appears to have a good understanding of his disease and our treatment recommendations which are of curative intent.  He was encouraged to ask questions that were answered to his stated satisfaction. He has freely signed written consent to proceed today in the office and a copy of this document will be placed in his medical record. His procedure is tentatively scheduled for 08/08/23 in collaboration with Dr. Devere and we will see him back for his post-procedure visit approximately 3 weeks thereafter. We look forward to continuing to participate in his care. He knows that he is welcome to call with any questions or concerns at any time in the interim.  I personally spent 30 minutes in this encounter including chart review, reviewing radiological studies, meeting face-to-face with the patient, entering orders and completing documentation.    Sabra MICAEL Rusk, MMS, PA-C [place]  [facility] Radiation Oncology Physician Assistant Direct Dial: [phone]  Fax: [phone]

## 2023-07-13 ENCOUNTER — Ambulatory Visit
Admission: RE | Admit: 2023-07-13 | Discharge: 2023-07-13 | Disposition: A | Discharge status: Home / Self Care | Payer: Medicare Other | Source: Ambulatory Visit | Attending: Urology | Admitting: Urology

## 2023-07-13 ENCOUNTER — Encounter: Payer: Self-pay | Admitting: Urology

## 2023-07-13 ENCOUNTER — Ambulatory Visit
Admission: RE | Admit: 2023-07-13 | Discharge: 2023-07-13 | Disposition: A | Discharge status: Home / Self Care | Payer: Medicare Other | Source: Ambulatory Visit | Attending: Radiation Oncology | Admitting: Radiation Oncology

## 2023-07-13 VITALS — Resp 19 | Ht 70.5 in | Wt 183.0 lb

## 2023-07-13 DIAGNOSIS — C61 Malignant neoplasm of prostate: Secondary | ICD-10-CM | POA: Diagnosis present

## 2023-07-19 NOTE — Progress Notes (Signed)
Sent message, via epic in basket, requesting orders in epic from Careers adviser.

## 2023-07-21 ENCOUNTER — Other Ambulatory Visit: Payer: Self-pay | Admitting: Urology

## 2023-07-21 NOTE — Progress Notes (Signed)
Second request for pre op orders: Left voicemail for Barbara Cower.

## 2023-07-23 NOTE — Progress Notes (Signed)
COVID Vaccine received:  []  No [x]  Yes Date of any COVID positive Test in last 90 days: none  PCP - Andi Devon, MD [phone]  Cardiologist - none  Chest x-ray - 06-24-2020  2v  Epic EKG -  will do at PST 07-25-23 Stress Test -  ECHO -  Cardiac Cath -   PCR screen: []  Ordered & Completed []   No Order but Needs PROFEND     [x]   N/A for this surgery  Surgery Plan:  [x]  Ambulatory   []  Outpatient in bed  []  Admit Anesthesia:    [x]  General  []  Spinal  []   Choice []   MAC  Bowel Prep - []  No  [x]   Yes _Fleet enema x1  Pacemaker / ICD device [x]  No []  Yes   Spinal Cord Stimulator:[x]  No []  Yes       History of Sleep Apnea? [x]  No []  Yes   CPAP used?- [x]  No []  Yes    Does the patient monitor blood sugar?   [x]  N/A   []  No []  Yes  Patient has: [x]  NO Hx DM   []  Pre-DM   []  DM1  []   DM2  Blood Thinner / Instructions:  none Aspirin Instructions:  ASA 81mg    hold 5-7 days  ERAS Protocol Ordered: [x]  No  []  Yes Patient is to be NPO after: Midnight  Dental hx: []  Dentures:  [x]  N/A      []  Bridge or Partial:                   []  Loose or Damaged teeth:   Activity level: Patient is able to climb a flight of stairs without difficulty; [x]  No CP  [x]  No SOB  Patient can perform ADLs without assistance.   Anesthesia review: HTN, CKD  Patient denies shortness of breath, fever, cough and chest pain at PAT appointment.  Patient verbalized understanding and agreement to the Pre-Surgical Instructions that were given to them at this PAT appointment. Patient was also educated of the need to review these PAT instructions again prior to his surgery.I reviewed the appropriate phone numbers to call if they have any and questions or concerns.

## 2023-07-23 NOTE — Patient Instructions (Signed)
SURGICAL WAITING ROOM VISITATION Patients having surgery or a procedure may have no more than 2 support people in the waiting area - these visitors may rotate in the visitor waiting room.   Due to an increase in RSV and influenza rates and associated hospitalizations, children ages 30 and under may not visit patients in [facility] hospitals. If the patient needs to stay at the hospital during part of their recovery, the visitor guidelines for inpatient rooms apply.  PRE-OP VISITATION  Pre-op nurse will coordinate an appropriate time for 1 support person to accompany the patient in pre-op.  This support person may not rotate.  This visitor will be contacted when the time is appropriate for the visitor to come back in the pre-op area.  Please refer to the [facility] website for the visitor guidelines for Inpatients (after your surgery is over and you are in a regular room).  You are not required to quarantine at this time prior to your surgery. However, you must do this: Hand Hygiene often Do NOT share personal items Notify your provider if you are in close contact with someone who has COVID or you develop fever 100.4 or greater, new onset of sneezing, cough, sore throat, shortness of breath or body aches.  If you test positive for Covid or have been in contact with anyone that has tested positive in the last 10 days please notify you surgeon.    Your procedure is scheduled on:  Tuesday  August 08, 2023  Report to [facility] Main Entrance: [address]sen entrance where the [employer] is available.   Report to admitting at: 05:15    AM  Call this number if you have any questions or problems the morning of surgery [phone]  DO NOT EAT OR DRINK ANYTHING AFTER MIDNIGHT THE NIGHT PRIOR TO YOUR SURGERY / PROCEDURE.   FOLLOW  ANY ADDITIONAL PRE OP INSTRUCTIONS YOU RECEIVED FROM YOUR SURGEON'S OFFICE!!!  FLEET ENEMA: Obtain one(1) Fleet Enema (sodium phosphate 7-19 gm / 118 ml enema)  and use (according to the directions on the box) the morning prior to your surgery.   If you have any questions, please contact your Surgeon's office for additional information.    Oral Hygiene is also important to reduce your risk of infection.        Remember - BRUSH YOUR TEETH THE MORNING OF SURGERY WITH YOUR REGULAR TOOTHPASTE  Do NOT smoke after Midnight the night before surgery.  STOP TAKING all Vitamins, Herbs and supplements 1 week before your surgery.   Take ONLY these medicines the morning of surgery with A SIP OF WATER: amlodipine, levothyroxine, tamsulosin.  You may use your Eye drops.   You may not have any metal on your body including  jewelry, and body piercing  Do not wear lotions, powders, cologne, or deodorant  Men may shave face and neck.  Contacts, Hearing Aids, dentures or bridgework may not be worn into surgery. DENTURES WILL BE REMOVED PRIOR TO SURGERY PLEASE DO NOT APPLY "Poly grip" OR ADHESIVES!!!  Patients discharged on the day of surgery will not be allowed to drive home.  Someone NEEDS to stay with you for the first 24 hours after anesthesia.  Do not bring your home medications to the hospital. The Pharmacy will dispense medications listed on your medication list to you during your admission in the Hospital.  Please read over the following fact sheets you were given: IF YOU HAVE QUESTIONS ABOUT YOUR PRE-OP INSTRUCTIONS, PLEASE CALL [phone].  [facility] - Preparing for Surgery Before surgery, you can play an important role.  Because skin is not sterile, your skin needs to be as free of germs as possible.  You can reduce the number of germs on your skin by washing with CHG (chlorahexidine gluconate) soap before surgery.  CHG is an antiseptic cleaner which kills germs and bonds with the skin to continue killing germs even after washing. Please DO NOT use if you have an allergy to CHG or antibacterial soaps.  If your skin becomes reddened/irritated stop  using the CHG and inform your nurse when you arrive at Short Stay. Do not shave (including legs and underarms) for at least 48 hours prior to the first CHG shower.  You may shave your face/neck.  Please follow these instructions carefully:  1.  Shower with CHG Soap the night before surgery and the  morning of surgery.  2.  If you choose to wash your hair, wash your hair first as usual with your normal  shampoo.  3.  After you shampoo, rinse your hair and body thoroughly to remove the shampoo.                             4.  Use CHG as you would any other liquid soap.  You can apply chg directly to the skin and wash.  Gently with a scrungie or clean washcloth.  5.  Apply the CHG Soap to your body ONLY FROM THE NECK DOWN.   Do not use on face/ open                           Wound or open sores. Avoid contact with eyes, ears mouth and genitals (private parts).                       Wash face,  Genitals (private parts) with your normal soap.             6.  Wash thoroughly, paying special attention to the area where your  surgery  will be performed.  7.  Thoroughly rinse your body with warm water from the neck down.  8.  DO NOT shower/wash with your normal soap after using and rinsing off the CHG Soap.            9.  Pat yourself dry with a clean towel.            10.  Wear clean pajamas.            11.  Place clean sheets on your bed the night of your first shower and do not  sleep with pets.  ON THE DAY OF SURGERY : Do not apply any lotions/deodorants the morning of surgery.  Please wear clean clothes to the hospital/surgery center.    FAILURE TO FOLLOW THESE INSTRUCTIONS MAY RESULT IN THE CANCELLATION OF YOUR SURGERY  PATIENT SIGNATURE_________________________________  NURSE SIGNATURE__________________________________  ________________________________________________________________________

## 2023-07-25 ENCOUNTER — Other Ambulatory Visit: Payer: Self-pay

## 2023-07-25 ENCOUNTER — Encounter (HOSPITAL_COMMUNITY)
Admission: RE | Admit: 2023-07-25 | Discharge: 2023-07-25 | Disposition: A | Discharge status: Home / Self Care | Payer: Medicare Other | Source: Ambulatory Visit | Attending: Urology

## 2023-07-25 ENCOUNTER — Encounter (HOSPITAL_COMMUNITY): Payer: Self-pay

## 2023-07-25 VITALS — BP 121/61 | HR 69 | Temp 98.2°F | Resp 14 | Ht 70.5 in | Wt 186.0 lb

## 2023-07-25 DIAGNOSIS — Z01818 Encounter for other preprocedural examination: Secondary | ICD-10-CM | POA: Insufficient documentation

## 2023-07-25 DIAGNOSIS — C61 Malignant neoplasm of prostate: Secondary | ICD-10-CM | POA: Diagnosis not present

## 2023-07-25 DIAGNOSIS — I10 Essential (primary) hypertension: Secondary | ICD-10-CM | POA: Insufficient documentation

## 2023-07-25 HISTORY — DX: Anemia, unspecified: D64.9

## 2023-07-25 HISTORY — DX: Unspecified osteoarthritis, unspecified site: M19.90

## 2023-07-25 HISTORY — DX: Essential (primary) hypertension: I10

## 2023-07-25 HISTORY — DX: Respiratory tuberculosis unspecified: A15.9

## 2023-07-25 LAB — CBC
HCT: 42.1 % (ref 39.0–52.0)
Hemoglobin: 13.3 g/dL (ref 13.0–17.0)
MCH: 31.8 pg (ref 26.0–34.0)
MCHC: 31.6 g/dL (ref 30.0–36.0)
MCV: 100.7 fL — ABNORMAL HIGH (ref 80.0–100.0)
Platelets: 217 10*3/uL (ref 150–400)
RBC: 4.18 MIL/uL — ABNORMAL LOW (ref 4.22–5.81)
RDW: 12.8 % (ref 11.5–15.5)
WBC: 8.2 10*3/uL (ref 4.0–10.5)
nRBC: 0 % (ref 0.0–0.2)

## 2023-07-25 LAB — BASIC METABOLIC PANEL
Anion gap: 9 (ref 5–15)
BUN: 21 mg/dL (ref 8–23)
CO2: 28 mmol/L (ref 22–32)
Calcium: 9.6 mg/dL (ref 8.9–10.3)
Chloride: 105 mmol/L (ref 98–111)
Creatinine, Ser: 1.21 mg/dL (ref 0.61–1.24)
GFR, Estimated: >60 mL/min (ref >60)
Glucose, Bld: 91 mg/dL (ref 70–99)
Potassium: 3.8 mmol/L (ref 3.5–5.1)
Sodium: 142 mmol/L (ref 135–145)

## 2023-08-01 ENCOUNTER — Encounter: Payer: Self-pay | Admitting: Cardiology

## 2023-08-01 ENCOUNTER — Ambulatory Visit: Payer: Medicare Other | Attending: Cardiology | Admitting: Cardiology

## 2023-08-01 VITALS — BP 130/72 | HR 76 | Ht 70.0 in | Wt 188.0 lb

## 2023-08-01 DIAGNOSIS — Z01818 Encounter for other preprocedural examination: Secondary | ICD-10-CM | POA: Insufficient documentation

## 2023-08-01 DIAGNOSIS — I10 Essential (primary) hypertension: Secondary | ICD-10-CM | POA: Diagnosis present

## 2023-08-01 DIAGNOSIS — E782 Mixed hyperlipidemia: Secondary | ICD-10-CM | POA: Diagnosis present

## 2023-08-01 DIAGNOSIS — R9431 Abnormal electrocardiogram [ECG] [EKG]: Secondary | ICD-10-CM | POA: Insufficient documentation

## 2023-08-01 NOTE — Progress Notes (Signed)
 Cardiology Office Note:    Date:  08/05/2023   ID:  Frederick Beard, DOB [birth year], MRN [id number]  PCP:  Andi Devon, MD  Cardiologist:  Thomasene Ripple, DO  Electrophysiologist:  None   Referring MD: Andi Devon, MD    History of Present Illness:    Frederick Beard is a 73 y.o. male with a history of hypertension and hyperlipidemia, presents for evaluation of an abnormal EKG. He denies any chest pain or shortness of breath. He is active, exercising two to three times a week, and walking up to four miles. He has been compliant with his medications, including amlodipine for hypertension, a statin for hyperlipidemia, and aspirin for cardiovascular protection. He also takes thyroid medication. He has no history of stroke or heart attack. He has not had a stress test since his time in the [employer]. Recently, he has noticed mild ankle swelling, particularly after long walks or trips.  He was asked to see cardiology preoperatively due to abnormal EKG in preparation of urological surgery.   No complaints today    Past Medical History:  Diagnosis Date   Anemia    Arthritis    Hypertension    Prostate cancer (HCC)    Tuberculosis    positive for TB in 1980, treated while in the [employer]    Past Surgical History:  Procedure Laterality Date   FRACTURE SURGERY Right 2018   ORIF elbow fx   PROSTATE BIOPSY     TONSILLECTOMY     TRANSPHENOIDAL / TRANSNASAL HYPOPHYSECTOMY / RESECTION PITUITARY TUMOR  1983   for benign adenoma    Current Medications: Current Meds  Medication Sig   Acetylcysteine (NAC PO) Take 1 capsule by mouth at bedtime.   amLODipine (NORVASC) 5 MG tablet Take 5 mg by mouth daily.   aspirin EC 81 MG tablet Take 81 mg by mouth daily. Swallow whole.   B Complex-Biotin-FA (B COMPLETE PO) Take 1 capsule by mouth at bedtime.   Cholecalciferol (VITAMIN D3 ULTRA STRENGTH) 125 MCG (5000 UT) capsule Take 5,000 Units by mouth daily.   Coenzyme Q10 (CO Q-10) 100 MG CAPS  Take 100 mg by mouth daily.   FeFum-FePoly-FA-B Cmp-C-Biot (INTEGRA PLUS PO) Take 1 capsule by mouth at bedtime.   hydrocortisone (CORTEF) 20 MG tablet Take 20 mg by mouth daily.   hydrocortisone (CORTEF) 5 MG tablet Take 5 mg by mouth at bedtime.   latanoprost (XALATAN) 0.005 % ophthalmic solution Place 1 drop into both eyes at bedtime.   levothyroxine (SYNTHROID) 100 MCG tablet Take 100 mcg by mouth daily before breakfast.   Misc Natural Products (PROSTATE SUPPORT PO) Take 1 capsule by mouth 2 (two) times daily.   Multiple Vitamins-Minerals (EMERGEN-C IMMUNE) PACK Take 1 packet by mouth daily.   Omega-3 Fatty Acids (FISH OIL ULTRA) 1400 MG CAPS Take 1,400 mg by mouth daily.   Probiotic Product (PROBIOTIC PO) Take 1 capsule by mouth at bedtime. UltraFlora IB   RESVERATROL PO Take 1 capsule by mouth daily.   rosuvastatin (CRESTOR) 20 MG tablet Take 20 mg by mouth at bedtime.   tadalafil (CIALIS) 5 MG tablet Take 5 mg by mouth at bedtime.   tamsulosin (FLOMAX) 0.4 MG CAPS capsule Take 0.4 mg by mouth 2 (two) times daily.   Testosterone 20.25 MG/1.25GM (1.62%) GEL Place 3 packets onto the skin daily.     Allergies:   Patient has no known allergies.   Social History   Socioeconomic History   Marital status: Married  Spouse name: Not on file   Number of children: Not on file   Years of education: Not on file   Highest education level: Not on file  Occupational History   Occupation: Production designer, theatre/television/film    Comment: retired  Tobacco Use   Smoking status: Never   Smokeless tobacco: Never  Vaping Use   Vaping status: Never Used  Substance and Sexual Activity   Alcohol use: Not Currently   Drug use: Never   Sexual activity: Yes  Other Topics Concern   Not on file  Social History Narrative   Not on file   Social Drivers of Health   Financial Resource Strain: Not on file  Food Insecurity: No Food Insecurity (07/12/2023)   Hunger Vital Sign    Worried About Running Out of Food in  the Last Year: Never true    Ran Out of Food in the Last Year: Never true  Transportation Needs: No Transportation Needs (07/12/2023)   PRAPARE - Administrator, Civil Service (Medical): No    Lack of Transportation (Non-Medical): No  Physical Activity: Not on file  Stress: Not on file  Social Connections: Not on file     Family History: The patient's family history includes Prostate cancer in his brother and brother. There is no history of Breast cancer, Colon cancer, or Pancreatic cancer.  ROS:   Review of Systems  Constitution: Negative for decreased appetite, fever and weight gain.  HENT: Negative for congestion, ear discharge, hoarse voice and sore throat.   Eyes: Negative for discharge, redness, vision loss in right eye and visual halos.  Cardiovascular: Negative for chest pain, dyspnea on exertion, leg swelling, orthopnea and palpitations.  Respiratory: Negative for cough, hemoptysis, shortness of breath and snoring.   Endocrine: Negative for heat intolerance and polyphagia.  Hematologic/Lymphatic: Negative for bleeding problem. Does not bruise/bleed easily.  Skin: Negative for flushing, nail changes, rash and suspicious lesions.  Musculoskeletal: Negative for arthritis, joint pain, muscle cramps, myalgias, neck pain and stiffness.  Gastrointestinal: Negative for abdominal pain, bowel incontinence, diarrhea and excessive appetite.  Genitourinary: Negative for decreased libido, genital sores and incomplete emptying.  Neurological: Negative for brief paralysis, focal weakness, headaches and loss of balance.  Psychiatric/Behavioral: Negative for altered mental status, depression and suicidal ideas.  Allergic/Immunologic: Negative for HIV exposure and persistent infections.    EKGs/Labs/Other Studies Reviewed:    The following studies were reviewed today:   EKG:  The ekg ordered today demonstrates sinus rhythm with first degree AV block.   Recent Labs: 07/25/2023:  BUN 21; Creatinine, Ser 1.21; Hemoglobin 13.3; Platelets 217; Potassium 3.8; Sodium 142  Recent Lipid Panel No results found for: "CHOL", "TRIG", "HDL", "CHOLHDL", "VLDL", "LDLCALC", "LDLDIRECT"  Physical Exam:    VS:  BP 130/72 (BP Location: Right Arm, Patient Position: Sitting, Cuff Size: Normal)   Pulse 76   Ht 5\' 10"  (1.778 m)   Wt 188 lb (85.3 kg)   SpO2 95%   BMI 26.98 kg/m     Wt Readings from Last 3 Encounters:  08/01/23 188 lb (85.3 kg)  07/25/23 186 lb (84.4 kg)  07/12/23 183 lb (83 kg)     GEN: Well nourished, well developed in no acute distress HEENT: Normal NECK: No JVD; No carotid bruits LYMPHATICS: No lymphadenopathy CARDIAC: S1S2 noted,RRR, 2/6 holosystolic murmurs, rubs, gallops RESPIRATORY:  Clear to auscultation without rales, wheezing or rhonchi  ABDOMEN: Soft, non-tender, non-distended, +bowel sounds, no guarding. EXTREMITIES: No edema, No cyanosis, no clubbing  MUSCULOSKELETAL:  No deformity  SKIN: Warm and dry NEUROLOGIC:  Alert and oriented x 3, non-focal PSYCHIATRIC:  Normal affect, good insight  ASSESSMENT:    1. Abnormal EKG   2. Pre-op evaluation   3. Essential hypertension   4. Mixed hyperlipidemia    PLAN:     Preoperative examination - know hypertensive heart disease. Referred for abnormal EKG. I do not see the ecg of reference. But here in the office today he is sinus with first degree AV block and no st segment changes.  No need for an ischemic evaluation. In the meantime with he hx of hypertensive heart disease it is imperative to get an echo to assess for any structural abnormalities or diastolic dysfunction and with his holosystolic murmur rule out valvular abnormalities.   If no wall motion abnormalities he can proceed with his surgery.   Hypertension Well controlled on Amlodipine. -Continue current medication regimen.  Hyperlipidemia On medication. -Continue current medication regimen.  Lower extremity edema Noted mild ankle  swelling, particularly after long walks or trips. No pain. Likely due to venous insufficiency. -Advise elevation of legs when sitting and monitor for worsening edema.  Follow-up in 16 weeks post-procedure to assess recovery and overall cardiac health.    The patient is in agreement with the above plan. The patient left the office in stable condition.  The patient will follow up in   Medication Adjustments/Labs and Tests Ordered: Current medicines are reviewed at length with the patient today.  Concerns regarding medicines are outlined above.  Orders Placed This Encounter  Procedures   ECHOCARDIOGRAM COMPLETE   No orders of the defined types were placed in this encounter.   Patient Instructions  Medication Instructions:  Your physician recommends that you continue on your current medications as directed. Please refer to the Current Medication list given to you today.  *If you need a refill on your cardiac medications before your next appointment, please call your pharmacy*   Testing/Procedures: Your physician has requested that you have an echocardiogram. Echocardiography is a painless test that uses sound waves to create images of your heart. It provides your doctor with information about the size and shape of your heart and how well your heart's chambers and valves are working. This procedure takes approximately one hour. There are no restrictions for this procedure. Please do NOT wear cologne, perfume, aftershave, or lotions (deodorant is allowed). Please arrive 15 minutes prior to your appointment time.  Please note: We ask at that you not bring children with you during ultrasound (echo/ vascular) testing. Due to room size and safety concerns, children are not allowed in the ultrasound rooms during exams. Our front office staff cannot provide observation of children in our lobby area while testing is being conducted. An adult accompanying a patient to their appointment will only be  allowed in the ultrasound room at the discretion of the ultrasound technician under special circumstances. We apologize for any inconvenience.    Follow-Up: At [facility], you and your health needs are our priority.  As part of our continuing mission to provide you with exceptional heart care, we have created designated Provider Care Teams.  These Care Teams include your primary Cardiologist (physician) and Advanced Practice Providers (APPs -  Physician Assistants and Nurse Practitioners) who all work together to provide you with the care you need, when you need it.   Your next appointment:   16 week(s)  Provider:   Thomasene Ripple, DO  Adopting a Healthy Lifestyle.  Know what a healthy weight is for you (roughly BMI <25) and aim to maintain this   Aim for 7+ servings of fruits and vegetables daily   65-80+ fluid ounces of water or unsweet tea for healthy kidneys   Limit to max 1 drink of alcohol per day; avoid smoking/tobacco   Limit animal fats in diet for cholesterol and heart health - choose grass fed whenever available   Avoid highly processed foods, and foods high in saturated/trans fats   Aim for low stress - take time to unwind and care for your mental health   Aim for 150 min of moderate intensity exercise weekly for heart health, and weights twice weekly for bone health   Aim for 7-9 hours of sleep daily   When it comes to diets, agreement about the perfect plan isnt easy to find, even among the experts. Experts at the [facility] of [employer] developed an idea known as the Healthy Eating Plate. Just imagine a plate divided into logical, healthy portions.   The emphasis is on diet quality:   Load up on vegetables and fruits - one-half of your plate: Aim for color and variety, and remember that potatoes dont count.   Go for whole grains - one-quarter of your plate: Whole wheat, barley, wheat berries, quinoa, oats, brown rice, and foods made with  them. If you want pasta, go with whole wheat pasta.   Protein power - one-quarter of your plate: Fish, chicken, beans, and nuts are all healthy, versatile protein sources. Limit red meat.   The diet, however, does go beyond the plate, offering a few other suggestions.   Use healthy plant oils, such as olive, canola, soy, corn, sunflower and peanut. Check the labels, and avoid partially hydrogenated oil, which have unhealthy trans fats.   If youre thirsty, drink water. Coffee and tea are good in moderation, but skip sugary drinks and limit milk and dairy products to one or two daily servings.   The type of carbohydrate in the diet is more important than the amount. Some sources of carbohydrates, such as vegetables, fruits, whole grains, and beans-are healthier than others.   Finally, stay active  Signed, Thomasene Ripple, DO  08/05/2023 10:45 AM    [facility]

## 2023-08-01 NOTE — Patient Instructions (Signed)
 Medication Instructions:  Your physician recommends that you continue on your current medications as directed. Please refer to the Current Medication list given to you today.  *If you need a refill on your cardiac medications before your next appointment, please call your pharmacy*   Testing/Procedures: Your physician has requested that you have an echocardiogram. Echocardiography is a painless test that uses sound waves to create images of your heart. It provides your doctor with information about the size and shape of your heart and how well your heart's chambers and valves are working. This procedure takes approximately one hour. There are no restrictions for this procedure. Please do NOT wear cologne, perfume, aftershave, or lotions (deodorant is allowed). Please arrive 15 minutes prior to your appointment time.  Please note: We ask at that you not bring children with you during ultrasound (echo/ vascular) testing. Due to room size and safety concerns, children are not allowed in the ultrasound rooms during exams. Our front office staff cannot provide observation of children in our lobby area while testing is being conducted. An adult accompanying a patient to their appointment will only be allowed in the ultrasound room at the discretion of the ultrasound technician under special circumstances. We apologize for any inconvenience.    Follow-Up: At [facility], you and your health needs are our priority.  As part of our continuing mission to provide you with exceptional heart care, we have created designated Provider Care Teams.  These Care Teams include your primary Cardiologist (physician) and Advanced Practice Providers (APPs -  Physician Assistants and Nurse Practitioners) who all work together to provide you with the care you need, when you need it.   Your next appointment:   16 week(s)  Provider:   Thomasene Ripple, DO

## 2023-08-03 ENCOUNTER — Other Ambulatory Visit (HOSPITAL_COMMUNITY): Payer: Medicare Other

## 2023-08-04 ENCOUNTER — Ambulatory Visit (HOSPITAL_COMMUNITY): Payer: Medicare Other | Attending: Cardiology

## 2023-08-04 DIAGNOSIS — Z01818 Encounter for other preprocedural examination: Secondary | ICD-10-CM | POA: Diagnosis present

## 2023-08-04 DIAGNOSIS — R9431 Abnormal electrocardiogram [ECG] [EKG]: Secondary | ICD-10-CM | POA: Insufficient documentation

## 2023-08-04 LAB — ECHOCARDIOGRAM COMPLETE
AV Vena cont: 0.2 cm
Area-P 1/2: 3.65 cm2
P 1/2 time: 434 ms
S' Lateral: 2.7 cm

## 2023-08-07 ENCOUNTER — Telehealth: Payer: Self-pay | Admitting: *Deleted

## 2023-08-07 ENCOUNTER — Encounter: Payer: Self-pay | Admitting: Cardiology

## 2023-08-07 ENCOUNTER — Encounter (HOSPITAL_COMMUNITY): Payer: Self-pay | Admitting: Urology

## 2023-08-07 ENCOUNTER — Encounter (HOSPITAL_COMMUNITY): Payer: Self-pay

## 2023-08-07 NOTE — H&P (Signed)
 Office Visit Report     08/02/2023   --------------------------------------------------------------------------------   Frederick Beard  MRN: [id number]  DOB: 1951-04-23, 73 year old Male  SSN: **[id number]   PRIMARY CARE:  Frederick Devon, MD  PRIMARY CARE FAX:  [phone]  REFERRING:  Frederick Beard  PROVIDER:  Rutherford Beard, M.D.  TREATING:  Frederick Beard, P.A.  LOCATION:  [facility] [phone]     --------------------------------------------------------------------------------   CC/HPI: CC: Prostate Cancer   Physician requesting consult: Dr. Devoria Beard  PCP: Dr. 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 Patsi Beard when his PSA was only 1.88 but when a left prostate nodule was noted. 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 He had another prostate biopsy in June 2019 due to a slight rise in the PSA and considering his prior biopsy findings. This biopsy also indicated only HGPIN/atypia. More recently, his PSA increased to 4.56 prompting an MRI of the prostate. 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 I saw him in consultation in June 2022 and he ultimately elected to pursue active surveillance management. An MRI in June 2023 indicated only a small left lateral mid PI-RADS 3 lesion. He underwent a confirmatory biopsy on 01/13/22 that showed 3 out of 12 biopsy cores positive for Gleason 3+3=6 adenocarcinoma and he continued surveillance. Another MRI of the prostate was performed in August 2024 that showed two small PI-RADS 3 lesions of the left lateral mid gland. An MR/US fusion biopsy was then performed on 03/09/23 that demonstrated both ROI lesions to be negative and 5 out of 12 systematic biopsy cores now positive with upgraded Gleason  3+4=7 disease noted in 4 of them. In 2 of those biopsies, there was only a small focus of cancer. In the other biopsies, there was only 5% of pattern 4 disease with 95% pattern 3 disease.   He does have hypogonadism and is on testosterone replacement with Frederick Freed s/p transphenoidal hypophysectomy.   Family history: Father.   Imaging studies:  MRI (10/15/20): No EPE, SVI, LAD, or bone lesions.   PMH: He has a history of GERD, hypertension, hypothyroidism, and hypogonadism (s/p pituitary resection).  PSH: No abdominal surgeries.   TNM stage: cT1c N0 Mx  PSA: 5.33  Gleason score: 3+4=7 (GG 2)  Biopsy (03/09/23): 5/18 cores positive  Left: L lateral mid (5%, 3+4=7), Left apex (5%, 3+3=6)  Right: R apex (20%, 3+4=7), R lateral apex (5%, 3+4=7), R lateral base (20%, 3+4=7)  ROI-1: Benign  ROI-2: Benign  Prostate volume: 41.0 cc  PSAD: 0.13   Nomogram  OC disease: 64%  EPE: 35%  SVI: 2%  LNI: 3%  PFS (5 year, 10 year): 95%, 91%   Urinary function: IPSS is 5. He is on tamsulosin bid.  Erectile function: SHIM score is 22. He is on tadalafil daily.   08/02/2023: Patient with history of Gleason 3+4 equal 7 prostate cancer presents today for preoperative history and physical exam in anticipation of undergoing brachytherapy with SpaceOAR placement on 08/08/2023 with Dr. Liliane Beard. Today he is doing well and without complaints. He denies gross hematuria, dysuria, fever/chills, flank pain, nausea/vomiting, chest pain, shortness of breath, headaches.     ALLERGIES: No Allergies    MEDICATIONS: Cialis 5 mg tablet  Levofloxacin 750 mg  tablet 1 tablet PO As Directed Take one hour prior to your scheduled procedure.  Tamsulosin Hcl 0.4 mg capsule TAKE 1 CAPSULE TWICE A DAY  Tamsulosin Hcl 0.4 mg capsule TAKE 1 CAPSULE TWICE A DAY  Amlodipine Besylate 5 mg tablet Oral  Aspirin Ec 81 mg tablet, delayed release  Co Q-10 100 mg-5 unit capsule Oral  Fish Oil  Fortesta 10 mg/0.5 gram per actuation (2 %)  gel in metered-dose pump Transdermal  Grape Seed Extract  Hydrocortisone  Hydromet  Integra Plus 125 mg iron-1 mg capsule  Latanoprost 0.005 % drops  Levothyroxine Sodium 175 mcg tablet Oral  Prostate Plus Supplement  Resveratin Plus  Rosuvastatin Calcium 20 mg tablet  Ultra Flora Plus  Vitamin D3     GU PSH: Prostate Needle Biopsy - 03/09/2023, 01/13/2022, 2022, 2019       PSH Notes: Transsphenoidal Hypophysectomy   NON-GU PSH: Remove Pituitary Gland - 2016 Surgical Pathology, Gross And Microscopic Examination For Prostate Needle - 03/09/2023, 01/13/2022, 2022, 2019 Visit Complexity (formerly GPC1X) - 03/17/2023, 02/13/2023     GU PMH: Prostate Cancer - 04/07/2023, - 03/17/2023, - 03/09/2023, - 02/13/2023, - 01/21/2022, - 01/13/2022, - 10/29/2021 (Stable), - 2022, - 2022 BPH w/o LUTS - 02/13/2023, - 10/29/2021 Elevated PSA - 2022, - 2022, - 2021 BPH w/LUTS - 2022, (Worsening), - 2018, - 2017, Benign localized hyperplasia of prostate with urinary obstruction, - 2017 Family Hx of Prostate Cancer - 2022, Brother recently diagnosed with prostate cancer. 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 in 2017 - 2018 Male ED, unspecified (Stable) - 2018 Primary hypogonadism (Stable) - 2018, - 2017, Hypogonadism, testicular, - 2017 ED due to arterial insufficiency - 2017 Prostate nodule w/o LUTS - 2017, Prostate nodule, - 2017 Nocturia, Nocturia - 2017 Urinary Frequency, Urinary frequency - 2017    NON-GU PMH: Encounter for general adult medical examination without abnormal findings, Encounter for preventive health examination - 2016 Personal history of other diseases of the circulatory system, History of hypertension - 2016 Personal history of other diseases of the digestive system, History of esophageal reflux - 2016    FAMILY HISTORY: Congestive Heart Failure - Runs In Family Nephritis - Runs In  Family Prostate Cancer - Runs In Family   SOCIAL HISTORY: Marital Status: Married Preferred Language: English; Ethnicity: Not Hispanic Or Latino; Race: Black or African American Current Smoking Status: Patient has never smoked.  Social Drinker.  Drinks 1 caffeinated drink per day. Patient's occupation is/was Retired.    REVIEW OF SYSTEMS:    GU Review Male:   Patient denies frequent urination, hard to postpone urination, burning/ pain with urination, get up at night to urinate, leakage of urine, stream starts and stops, trouble starting your stream, have to strain to urinate , erection problems, and penile pain.  Gastrointestinal (Upper):   Patient denies nausea, vomiting, and indigestion/ heartburn.  Gastrointestinal (Lower):   Patient denies diarrhea and constipation.  Constitutional:   Patient denies fever, night sweats, weight loss, and fatigue.  Skin:   Patient denies skin rash/ lesion and itching.  Eyes:   Patient denies blurred vision and double vision.  Ears/ Nose/ Throat:   Patient denies sore throat and sinus problems.  Hematologic/Lymphatic:   Patient denies swollen glands and easy bruising.  Cardiovascular:   Patient denies leg swelling and chest pains.  Respiratory:   Patient denies cough and shortness of breath.  Endocrine:  Patient denies excessive thirst.  Musculoskeletal:   Patient denies joint pain and back pain.  Neurological:   Patient denies headaches and dizziness.  Psychologic:   Patient denies depression and anxiety.   VITAL SIGNS:      08/02/2023 09:14 AM  Weight 183 lb / 83.01 kg  Height 70.5 in / 179.07 cm  BP 126/61 mmHg  Pulse 50 /min  BMI 25.9 kg/m   MULTI-SYSTEM PHYSICAL EXAMINATION:    Constitutional: Well-nourished. No physical deformities. Normally developed. Good grooming.  Neck: Neck symmetrical, not swollen. Normal tracheal position.  Respiratory: No labored breathing, no use of accessory muscles.   Cardiovascular: Normal temperature,  normal extremity pulses, no swelling, no varicosities.  Skin: No paleness, no jaundice, no cyanosis. No lesion, no ulcer, no rash.  Neurologic / Psychiatric: Oriented to time, oriented to place, oriented to person. No depression, no anxiety, no agitation.  Gastrointestinal: No mass, no tenderness, no rigidity, non obese abdomen.  Eyes: Normal conjunctivae. Normal eyelids.  Musculoskeletal: Normal gait and station of head and neck.     Complexity of Data:  Source Of History:  Patient, Medical Record Summary  Lab Test Review:   PSA  Records Review:   Pathology Reports, Previous Doctor Records, Previous Patient Records  Urine Test Review:   Urinalysis   02/07/23 07/08/22 10/22/21 04/23/21 08/14/20 02/24/20 08/26/19 05/20/19  PSA  Total PSA 5.33 ng/mL 4.93 ng/mL 4.29 ng/mL 3.69 ng/mL 4.56 ng/mL 3.90 ng/mL 3.95 ng/mL 3.62 ng/mL  Free PSA 1.13 ng/mL 1.15 ng/mL        % Free PSA 21 % PSA 23 % PSA          PROCEDURES:          Visit Complexity - G2211          Urinalysis w/Scope - 81001 Dipstick Dipstick Cont'd Micro  Color: Yellow Bilirubin: Neg WBC/hpf: 0 - 5/hpf  Appearance: Clear Ketones: Neg RBC/hpf: NS (Not Seen)  Specific Gravity: 1.015 Blood: Neg Bacteria: NS (Not Seen)  pH: 7.5 Protein: 1+ Cystals: NS (Not Seen)  Glucose: Neg Urobilinogen: 0.2 Casts: NS (Not Seen)    Nitrites: Neg Trichomonas: Not Present    Leukocyte Esterase: Neg Mucous: Not Present      Epithelial Cells: 0 - 5/hpf      Yeast: NS (Not Seen)      Sperm: Not Present    Notes:      ASSESSMENT:      ICD-10 Details  1 GU:   Prostate Cancer - C61 Chronic, Life Threatening   PLAN:           Orders Labs Urine Culture          Schedule Return Visit/Planned Activity: Keep Scheduled Appointment - Schedule Surgery          Document Letter(s):  Created for Patient: Clinical Summary         Notes:   Culture sent. There are no changes in the patients history or physical exam since last evaluation. Pt  is scheduled to undergo brachytherapy with SpaceOAR placement on 08/08/23.   All questions were answered to the best of my ability.

## 2023-08-07 NOTE — Telephone Encounter (Signed)
 CALLED PATIENT TO REMIND OF PROCEDURE FOR 08-08-23, SPOKE WITH PATIENT AND HE IS AWARE OF THIS PROCEDURE

## 2023-08-07 NOTE — Progress Notes (Signed)
 Case: [id number] Date/Time: 08/08/23 0715   Procedures:      RADIOACTIVE SEED IMPLANT/BRACHYTHERAPY IMPLANT     SPACE OAR INSTILLATION   Anesthesia type: General   Pre-op diagnosis: PROSTATE CANCER   Location: [facility] ROOM 03 / [facility]   Surgeons: Rene Paci, MD       DISCUSSION: Frederick Beard 73 yo male who presents to PAT prior to surgery above. 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  Patient evaluated in PAT for abnormal EKG. 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 There is no EKG to compare it to. Patient denies any symptoms. No chest pain, SOB, palpitations, syncope. He was referred to Cardiology for clearance. Seen by Dr. Servando Salina on 08/01/23. Echo was obtained which showed normal EF with mild-mod MR. Cleared for surgery:  "Preoperative examination - know hypertensive heart disease. Referred for abnormal EKG. I do not see the ecg of reference. But here in the office today he is sinus with first degree AV block and no st segment changes. No need for an ischemic evaluation. In the meantime with he hx of hypertensive heart disease it is imperative to get an echo to assess for any structural abnormalities or diastolic dysfunction and with his holosystolic murmur rule out valvular abnormalities."  VS: BP 121/61 Comment: right arm sitting  Pulse 69   Temp 36.8 C (Oral)   Resp 14   Ht 5' 10.5" (1.791 m)   Wt 84.4 kg   SpO2 100%   BMI 26.31 kg/m   PROVIDERS: Andi Devon, MD Cardiology: Thomasene Ripple, DO  LABS: Labs reviewed: Acceptable for surgery. (all labs ordered are listed, but only abnormal results are displayed)  Labs Reviewed  CBC - Abnormal; Notable for the following components:      Result Value   RBC 4.18 (*)    MCV 100.7 (*)    All other components within normal limits  BASIC METABOLIC PANEL     IMAGES:   EKG 07/25/23:  Sinus rhythm with 2nd degree A-V block (Mobitz I), rate 57  CV:  Echo  08/04/23:  IMPRESSIONS     1. Left ventricular ejection fraction, by estimation, is 60 to 65%. The  left ventricle has normal function. The left ventricle has no regional  wall motion abnormalities. Left ventricular diastolic parameters were  normal. The average left ventricular  global longitudinal strain is -18.7 %. The global longitudinal strain is  normal.   2. Right ventricular systolic function is normal. The right ventricular  size is normal. There is normal pulmonary artery systolic pressure. The  estimated right ventricular systolic pressure is 24.2 mmHg.   3. The mitral valve is normal in structure. Mild to moderate mitral valve  regurgitation. No evidence of mitral stenosis.   4. The aortic valve is tricuspid. Aortic valve regurgitation is trivial.  No aortic stenosis is present.   5. The inferior vena cava is normal in size with greater than 50%  respiratory variability, suggesting right atrial pressure of 3 mmHg.   Comparison(s): There is diastolic MR and diastolic TR due to first degree  AV block. There is occasional second degree, Mobitz type 1 AV block seen,  with mild bradycardia (minimum 49 bpm is seen).   Past Medical History:  Diagnosis Date   Anemia    Arthritis    Hypertension    Prostate cancer (HCC)    Tuberculosis    positive for TB in 1980, treated  while in the [employer]    Past Surgical History:  Procedure Laterality Date   FRACTURE SURGERY Right 2018   ORIF elbow fx   PROSTATE BIOPSY     TONSILLECTOMY     TRANSPHENOIDAL / TRANSNASAL HYPOPHYSECTOMY / RESECTION PITUITARY TUMOR  1983   for benign adenoma    MEDICATIONS:  Acetylcysteine (NAC PO)   amLODipine (NORVASC) 5 MG tablet   aspirin EC 81 MG tablet   B Complex-Biotin-FA (B COMPLETE PO)   Cholecalciferol (VITAMIN D3 ULTRA STRENGTH) 125 MCG (5000 UT) capsule   Coenzyme Q10 (CO Q-10) 100 MG CAPS   FeFum-FePoly-FA-B Cmp-C-Biot (INTEGRA PLUS PO)   hydrocortisone (CORTEF) 20 MG tablet    hydrocortisone (CORTEF) 5 MG tablet   latanoprost (XALATAN) 0.005 % ophthalmic solution   levothyroxine (SYNTHROID) 100 MCG tablet   Misc Natural Products (PROSTATE SUPPORT PO)   Multiple Vitamins-Minerals (EMERGEN-C IMMUNE) PACK   Omega-3 Fatty Acids (FISH OIL ULTRA) 1400 MG CAPS   Probiotic Product (PROBIOTIC PO)   RESVERATROL PO   rosuvastatin (CRESTOR) 20 MG tablet   tadalafil (CIALIS) 5 MG tablet   tamsulosin (FLOMAX) 0.4 MG CAPS capsule   Testosterone 20.25 MG/1.25GM (1.62%) GEL   No current facility-administered medications for this encounter.   Marcille Blanco [facility] Surgical Short Stay/Anesthesiology [facility] Phone [phone] 08/07/2023 8:51 AM

## 2023-08-08 ENCOUNTER — Encounter (HOSPITAL_COMMUNITY): Payer: Self-pay | Admitting: Urology

## 2023-08-08 ENCOUNTER — Ambulatory Visit (HOSPITAL_COMMUNITY): Payer: Self-pay | Admitting: Physician Assistant

## 2023-08-08 ENCOUNTER — Ambulatory Visit (HOSPITAL_COMMUNITY)
Admission: RE | Admit: 2023-08-08 | Discharge: 2023-08-08 | Disposition: A | Discharge status: Home / Self Care | Payer: Medicare Other | Attending: Urology | Admitting: Urology

## 2023-08-08 ENCOUNTER — Ambulatory Visit (HOSPITAL_BASED_OUTPATIENT_CLINIC_OR_DEPARTMENT_OTHER): Payer: Self-pay | Admitting: Anesthesiology

## 2023-08-08 ENCOUNTER — Encounter (HOSPITAL_COMMUNITY)
Admission: RE | Disposition: A | Discharge status: Home / Self Care | Payer: Self-pay | Source: Home / Self Care | Attending: Urology

## 2023-08-08 ENCOUNTER — Other Ambulatory Visit: Payer: Self-pay

## 2023-08-08 ENCOUNTER — Ambulatory Visit (HOSPITAL_COMMUNITY)

## 2023-08-08 DIAGNOSIS — I10 Essential (primary) hypertension: Secondary | ICD-10-CM | POA: Insufficient documentation

## 2023-08-08 DIAGNOSIS — M1990 Unspecified osteoarthritis, unspecified site: Secondary | ICD-10-CM | POA: Insufficient documentation

## 2023-08-08 DIAGNOSIS — D649 Anemia, unspecified: Secondary | ICD-10-CM | POA: Diagnosis not present

## 2023-08-08 DIAGNOSIS — K219 Gastro-esophageal reflux disease without esophagitis: Secondary | ICD-10-CM | POA: Diagnosis not present

## 2023-08-08 DIAGNOSIS — C61 Malignant neoplasm of prostate: Secondary | ICD-10-CM

## 2023-08-08 DIAGNOSIS — E039 Hypothyroidism, unspecified: Secondary | ICD-10-CM | POA: Insufficient documentation

## 2023-08-08 HISTORY — PX: RADIOACTIVE SEED IMPLANT: SHX5150

## 2023-08-08 HISTORY — PX: SPACE OAR INSTILLATION: SHX6769

## 2023-08-08 SURGERY — INSERTION, RADIATION SOURCE, PROSTATE
Anesthesia: General

## 2023-08-08 MED ORDER — CIPROFLOXACIN IN D5W 400 MG/200ML IV SOLN
400.0000 mg | INTRAVENOUS | Status: AC
  Administered 2023-08-08: 400 mg via INTRAVENOUS
  Filled 2023-08-08: qty 200

## 2023-08-08 MED ORDER — PROPOFOL 10 MG/ML IV BOLUS
INTRAVENOUS | Status: AC
  Filled 2023-08-08: qty 20

## 2023-08-08 MED ORDER — CHLORHEXIDINE GLUCONATE 0.12 % MT SOLN
15.0000 mL | Freq: Once | OROMUCOSAL | Status: AC
  Administered 2023-08-08: 15 mL via OROMUCOSAL

## 2023-08-08 MED ORDER — ONDANSETRON HCL 4 MG/2ML IJ SOLN
INTRAMUSCULAR | Status: AC
  Filled 2023-08-08: qty 2

## 2023-08-08 MED ORDER — LIDOCAINE HCL (PF) 2 % IJ SOLN
INTRAMUSCULAR | Status: DC | PRN
  Administered 2023-08-08: 60 mg via INTRADERMAL

## 2023-08-08 MED ORDER — DEXMEDETOMIDINE HCL IN NACL 80 MCG/20ML IV SOLN
INTRAVENOUS | Status: AC
  Filled 2023-08-08: qty 20

## 2023-08-08 MED ORDER — SUGAMMADEX SODIUM 200 MG/2ML IV SOLN
INTRAVENOUS | Status: AC
  Filled 2023-08-08: qty 2

## 2023-08-08 MED ORDER — ORAL CARE MOUTH RINSE: 15.0000 mL | Freq: Once | OROMUCOSAL | Status: AC

## 2023-08-08 MED ORDER — EPHEDRINE SULFATE-NACL 50-0.9 MG/10ML-% IV SOSY
PREFILLED_SYRINGE | INTRAVENOUS | Status: DC | PRN
  Administered 2023-08-08 (×2): 7.5 mg via INTRAVENOUS
  Administered 2023-08-08 (×2): 5 mg via INTRAVENOUS
  Administered 2023-08-08: 7.5 mg via INTRAVENOUS

## 2023-08-08 MED ORDER — EPHEDRINE 5 MG/ML INJ
INTRAVENOUS | Status: AC
  Filled 2023-08-08: qty 5

## 2023-08-08 MED ORDER — FENTANYL CITRATE (PF) 100 MCG/2ML IJ SOLN
INTRAMUSCULAR | Status: AC
  Filled 2023-08-08: qty 2

## 2023-08-08 MED ORDER — ONDANSETRON HCL 4 MG/2ML IJ SOLN
INTRAMUSCULAR | Status: DC | PRN
  Administered 2023-08-08: 4 mg via INTRAVENOUS

## 2023-08-08 MED ORDER — IOHEXOL 300 MG/ML  SOLN
INTRAMUSCULAR | Status: DC | PRN
  Administered 2023-08-08: 10 mL

## 2023-08-08 MED ORDER — LIDOCAINE HCL (PF) 2 % IJ SOLN
INTRAMUSCULAR | Status: AC
  Filled 2023-08-08: qty 10

## 2023-08-08 MED ORDER — LACTATED RINGERS IV SOLN: INTRAVENOUS | Status: DC

## 2023-08-08 MED ORDER — DEXAMETHASONE SODIUM PHOSPHATE 10 MG/ML IJ SOLN
INTRAMUSCULAR | Status: DC | PRN
  Administered 2023-08-08: 10 mg via INTRAVENOUS

## 2023-08-08 MED ORDER — DEXAMETHASONE SODIUM PHOSPHATE 10 MG/ML IJ SOLN
INTRAMUSCULAR | Status: AC
  Filled 2023-08-08: qty 2

## 2023-08-08 MED ORDER — SUGAMMADEX SODIUM 200 MG/2ML IV SOLN
INTRAVENOUS | Status: DC | PRN
  Administered 2023-08-08: 200 mg via INTRAVENOUS

## 2023-08-08 MED ORDER — MIDAZOLAM HCL 5 MG/5ML IJ SOLN
INTRAMUSCULAR | Status: DC | PRN
  Administered 2023-08-08: 2 mg via INTRAVENOUS

## 2023-08-08 MED ORDER — STERILE WATER FOR IRRIGATION IR SOLN
Status: DC | PRN
  Administered 2023-08-08: 500 mL

## 2023-08-08 MED ORDER — DEXAMETHASONE SODIUM PHOSPHATE 10 MG/ML IJ SOLN
INTRAMUSCULAR | Status: AC
  Filled 2023-08-08: qty 1

## 2023-08-08 MED ORDER — SODIUM CHLORIDE 0.9 % IR SOLN
Status: DC | PRN
  Administered 2023-08-08: 1000 mL via INTRAVESICAL

## 2023-08-08 MED ORDER — MIDAZOLAM HCL 2 MG/2ML IJ SOLN
INTRAMUSCULAR | Status: AC
  Filled 2023-08-08: qty 2

## 2023-08-08 MED ORDER — PROPOFOL 10 MG/ML IV BOLUS
INTRAVENOUS | Status: DC | PRN
  Administered 2023-08-08: 200 mg via INTRAVENOUS

## 2023-08-08 MED ORDER — FENTANYL CITRATE (PF) 100 MCG/2ML IJ SOLN
INTRAMUSCULAR | Status: DC | PRN
Start: 2023-08-08 — End: 2023-08-08
  Administered 2023-08-08 (×2): 50 ug via INTRAVENOUS

## 2023-08-08 MED ORDER — FENTANYL CITRATE PF 50 MCG/ML IJ SOSY: 25.0000 ug | PREFILLED_SYRINGE | INTRAMUSCULAR | Status: DC | PRN

## 2023-08-08 MED ORDER — ONDANSETRON HCL 4 MG/2ML IJ SOLN
INTRAMUSCULAR | Status: AC
  Filled 2023-08-08: qty 4

## 2023-08-08 MED ORDER — FLEET ENEMA RE ENEM
1.0000 | ENEMA | Freq: Once | RECTAL | Status: DC
Start: 2023-08-09 — End: 2023-08-08

## 2023-08-08 MED ORDER — DEXMEDETOMIDINE HCL IN NACL 80 MCG/20ML IV SOLN
INTRAVENOUS | Status: DC | PRN
  Administered 2023-08-08: 8 ug via INTRAVENOUS

## 2023-08-08 MED ORDER — AMISULPRIDE (ANTIEMETIC) 5 MG/2ML IV SOLN: 10.0000 mg | Freq: Once | INTRAVENOUS | Status: DC | PRN

## 2023-08-08 MED ORDER — LIDOCAINE HCL (PF) 2 % IJ SOLN
INTRAMUSCULAR | Status: AC
  Filled 2023-08-08: qty 5

## 2023-08-08 MED ORDER — ROCURONIUM BROMIDE 10 MG/ML (PF) SYRINGE
PREFILLED_SYRINGE | INTRAVENOUS | Status: DC | PRN
  Administered 2023-08-08: 10 mg via INTRAVENOUS
  Administered 2023-08-08: 50 mg via INTRAVENOUS

## 2023-08-08 SURGICAL SUPPLY — 36 items
BAG COUNTER SPONGE SURGICOUNT (BAG) IMPLANT
BAG URINE DRAIN 2000ML AR STRL (UROLOGICAL SUPPLIES) ×1 IMPLANT
CATH FOLEY 2WAY SLVR 5CC 16FR (CATHETERS) ×2 IMPLANT
CATH ROBINSON RED A/P 20FR (CATHETERS) ×1 IMPLANT
COVER BACK TABLE 60X90IN (DRAPES) ×1 IMPLANT
COVER MAYO STAND STRL (DRAPES) ×1 IMPLANT
COVER SURGICAL LIGHT HANDLE (MISCELLANEOUS) ×1 IMPLANT
DRAPE U-SHAPE 47X51 STRL (DRAPES) ×1 IMPLANT
DRSG TEGADERM 4X4.75 (GAUZE/BANDAGES/DRESSINGS) ×1 IMPLANT
DRSG TEGADERM 8X12 (GAUZE/BANDAGES/DRESSINGS) ×1 IMPLANT
GLOVE BIO SURGEON STRL SZ7.5 (GLOVE) ×1 IMPLANT
GLOVE ECLIPSE 8.0 STRL XLNG CF (GLOVE) ×1 IMPLANT
GLOVE SURG LX STRL 7.5 STRW (GLOVE) ×2 IMPLANT
GOWN STRL REUS W/ TWL LRG LVL3 (GOWN DISPOSABLE) ×2 IMPLANT
GRID BRACH TEMP 18GA 2.8X3X.75 (MISCELLANEOUS) IMPLANT
HOLDER FOLEY CATH W/STRAP (MISCELLANEOUS) ×1 IMPLANT
I-25 Implant Seed IMPLANT
IMPL SPACEOAR SYSTEM 10ML (Spacer) ×1 IMPLANT
IMPLANT SPACEOAR SYSTEM 10ML (Spacer) ×1 IMPLANT
KIT TURNOVER KIT A (KITS) IMPLANT
MARKER SKIN DUAL TIP RULER LAB (MISCELLANEOUS) ×1 IMPLANT
NDL BRACHY 18G 5PK (NEEDLE) ×5 IMPLANT
NDL BRACHY 18G SINGLE (NEEDLE) ×1 IMPLANT
NDL PK MORGANSTERN STABILIZ (NEEDLE) IMPLANT
NEEDLE BRACHY 18G 5PK (NEEDLE) ×3 IMPLANT
NEEDLE BRACHY 18G SINGLE (NEEDLE) ×4 IMPLANT
NEEDLE PK MORGANSTERN STABILIZ (NEEDLE) ×1 IMPLANT
PACK CYSTO (CUSTOM PROCEDURE TRAY) ×1 IMPLANT
PENCIL SMOKE EVACUATOR (MISCELLANEOUS) IMPLANT
SHEATH ULTRASOUND LF (SHEATH) IMPLANT
SHEATH ULTRASOUND LTX NONSTRL (SHEATH) IMPLANT
SURGILUBE 2OZ TUBE FLIPTOP (MISCELLANEOUS) ×1 IMPLANT
SYR 10ML LL (SYRINGE) ×1 IMPLANT
TOWEL OR 17X26 10 PK STRL BLUE (TOWEL DISPOSABLE) ×1 IMPLANT
TRAY FOLEY MTR SLVR 16FR STAT (SET/KITS/TRAYS/PACK) IMPLANT
UNDERPAD 30X36 HEAVY ABSORB (UNDERPADS AND DIAPERS) ×1 IMPLANT

## 2023-08-08 NOTE — Transfer of Care (Signed)
 Immediate Anesthesia Transfer of Care Note  Patient: Frederick Beard  Procedure(s) Performed: Procedure(s): RADIOACTIVE SEED IMPLANT/BRACHYTHERAPY IMPLANT (N/A) SPACE OAR INSTILLATION (N/A)  Patient Location: PACU  Anesthesia Type:General  Level of Consciousness:  sedated, patient cooperative and responds to stimulation  Airway & Oxygen Therapy:Patient Spontanous Breathing and Patient connected to face mask oxgen  Post-op Assessment:  Report given to PACU RN and Post -op Vital signs reviewed and stable  Post vital signs:  Reviewed and stable  Last Vitals:  Vitals:   08/08/23 0544  BP: (!) 154/82  Pulse: (!) 51  Resp: 14  Temp: 36.8 C  SpO2: 99%    Complications: No apparent anesthesia complications

## 2023-08-08 NOTE — Anesthesia Preprocedure Evaluation (Signed)
 Anesthesia Evaluation  Patient identified by MRN, date of birth, ID band Patient awake    Reviewed: Allergy & Precautions, NPO status , Patient's Chart, lab work & pertinent test results  Airway Mallampati: II  TM Distance: >3 FB Neck ROM: Full    Dental  (+) Dental Advisory Given   Pulmonary neg pulmonary ROS   breath sounds clear to auscultation       Cardiovascular hypertension, Pt. on medications  Rhythm:Regular Rate:Normal     Neuro/Psych negative neurological ROS     GI/Hepatic negative GI ROS, Neg liver ROS,,,  Endo/Other  Hypothyroidism    Renal/GU Renal disease     Musculoskeletal  (+) Arthritis ,    Abdominal   Peds  Hematology  (+) Blood dyscrasia, anemia   Anesthesia Other Findings   Reproductive/Obstetrics                             Anesthesia Physical Anesthesia Plan  ASA: 2  Anesthesia Plan: General   Post-op Pain Management: Ofirmev IV (intra-op)*   Induction: Intravenous  PONV Risk Score and Plan: 2 and Dexamethasone, Ondansetron and Treatment may vary due to age or medical condition  Airway Management Planned: LMA  Additional Equipment:   Intra-op Plan:   Post-operative Plan: Extubation in OR  Informed Consent: I have reviewed the patients History and Physical, chart, labs and discussed the procedure including the risks, benefits and alternatives for the proposed anesthesia with the patient or authorized representative who has indicated his/her understanding and acceptance.     Dental advisory given  Plan Discussed with: CRNA  Anesthesia Plan Comments:        Anesthesia Quick Evaluation

## 2023-08-08 NOTE — Progress Notes (Signed)
  Radiation Oncology         [phone] ________________________________  Name: Frederick Beard MRN: [id number]  Date: 08/08/2023  DOB: 26-Oct-1950       Prostate Seed Implant  WU:[id number], Cala Bradford, MD  No ref. provider found  DIAGNOSIS: 73 year old man with Stage T1c, Gleason 3+4, adenocarcinoma of the prostate with a PSA of 5.33.   Oncology History  Malignant neoplasm of prostate (HCC)  11/10/2020 Initial Diagnosis   Malignant neoplasm of prostate (HCC)   03/09/2023 Cancer Staging   Staging form: Prostate, AJCC 8th Edition - Clinical stage from 03/09/2023: Stage IIB (cT1c, cN0, cM0, PSA: 5.3, Grade Group: 2) - Signed by Marcello Fennel, PA-C on 03/22/2023 Histopathologic type: Adenocarcinoma, NOS Stage prefix: Initial diagnosis Prostate specific antigen (PSA) range: Less than 10 Gleason primary pattern: 3 Gleason secondary pattern: 4 Gleason score: 7 Histologic grading system: 5 grade system Number of biopsy cores examined: 5 Number of biopsy cores positive: 18 Location of positive needle core biopsies: Both sides     No diagnosis found.  PROCEDURE: Insertion of radioactive I-125 seeds into the prostate gland.  RADIATION DOSE: 145 Gy, definitive therapy.  TECHNIQUE: Frederick Beard was brought to the operating room with the urologist. He was placed in the dorsolithotomy position. He was catheterized and a rectal tube was inserted. The perineum was shaved, prepped and draped. The ultrasound probe was then introduced by me into the rectum to see the prostate gland.  TREATMENT DEVICE: I attached the needle grid to the ultrasound probe stand and anchor needles were placed.  3D PLANNING: The prostate was imaged in 3D using a sagittal sweep of the prostate probe. These images were transferred to the planning computer. There, the prostate, urethra and rectum were defined on each axial reconstructed image. Then, the software created an optimized 3D plan and a few seed positions were  adjusted. The quality of the plan was reviewed using [facility] information for the target and the following two organs at risk:  Urethra and Rectum.  Then the accepted plan was printed and handed off to the radiation therapist.  Under my supervision, the custom loading of the seeds and spacers was carried out using the quick loader.  These pre-loaded needles were then placed into the needle holder.Marland Kitchen  PROSTATE VOLUME STUDY:  Using transrectal ultrasound the volume of the prostate was verified to be 50.6 cc.  SPECIAL TREATMENT PROCEDURE/SUPERVISION AND HANDLING: The pre-loaded needles were then delivered by the urologist under sagittal guidance. A total of 18 needles were used to deposit 70 seeds in the prostate gland. The individual seed activity was 0.483 mCi.  SpaceOAR:  Yes  COMPLEX SIMULATION: At the end of the procedure, an anterior radiograph of the pelvis was obtained to document seed positioning and count. Cystoscopy was performed by the urologist to check the urethra and bladder.  MICRODOSIMETRY: At the end of the procedure, the patient was emitting 0.10 mR/hr at 1 meter. Accordingly, he was considered safe for hospital discharge.  PLAN: The patient will return to the radiation oncology clinic for post implant CT dosimetry in three weeks.   ________________________________  Artist Pais Kathrynn Running, M.D.

## 2023-08-08 NOTE — Op Note (Signed)
 PATIENT:  Frederick Beard  PRE-OPERATIVE DIAGNOSIS:  Adenocarcinoma of the prostate  POST-OPERATIVE DIAGNOSIS:  Same  PROCEDURE:  1. I-125 radioactive seed implantation 2. Cystoscopy  3. Placement of SpaceOAR  SURGEON:  Rhoderick Moody, MD  Radiation oncologist: Margaretmary Dys, MD  ANESTHESIA:  General  EBL:  Minimal  DRAINS: None  INDICATION: Frederick Beard a 73 year old male with grade 2 prostate cancer who has elected to proceed with brachytherapy seed placement as definitive therapy.  He has been consented for the above procedures, voices understanding and wishes to proceed.  Description of procedure: After informed consent the patient was brought to the major OR, placed on the table and administered general anesthesia. He was then moved to the modified lithotomy position with his perineum perpendicular to the floor. His perineum and genitalia were then sterilely prepped. An official timeout was then performed. A 16 French Foley catheter was then placed in the bladder and filled with dilute contrast, a rectal tube was placed in the rectum and the transrectal ultrasound probe was placed in the rectum and affixed to the stand. He was then sterilely draped.  Real time ultrasonography was used along with the seed planning software. This was used to develop the seed plan including the number of needles as well as number of seeds required for complete and adequate coverage. Real-time ultrasonography was then used along with the previously developed plan and the Nucletron device to implant a total of 70 seeds using 18 needles. This proceeded without difficulty or complication.   I then proceeded with placement of SpaceOAR by introducing a needle with the bevel angled inferiorly approximately 2 cm superior to the anus. This was angled downward and under direct ultrasound was placed within the space between the prostatic capsule and rectum. This was confirmed with a small amount of sterile  saline injected and this was performed under direct ultrasound. I then attached the SpaceOAR to the needle and injected this in the space between the prostate and rectum with good placement noted.  A Foley catheter was then removed as well as the transrectal ultrasound probe and rectal probe. Flexible cystoscopy was then performed using the 16 French flexible scope which revealed a normal urethra throughout its length down to the sphincter which appeared intact. The prostatic urethra revealed bilobar hypertrophy but no evidence of obstruction, seeds, spacers or lesions. The bladder was then entered and fully and systematically inspected. The ureteral orifices were noted to be of normal configuration and position. The mucosa revealed no evidence of tumors. There were also no stones identified within the bladder. I noted no seeds or spacers on the floor of the bladder and retroflexion of the scope revealed no seeds protruding from the base of the prostate.  The cystoscope was then removed and the patient was awakened and taken to recovery room in stable and satisfactory condition. He tolerated procedure well and there were no intraoperative complications.  Plan: Discharge home

## 2023-08-08 NOTE — Anesthesia Procedure Notes (Signed)
 Procedure Name: Intubation Date/Time: 08/08/2023 7:57 AM  Performed by: Theodosia Quay, CRNAPre-anesthesia Checklist: Patient identified, Emergency Drugs available, Suction available, Patient being monitored and Timeout performed Patient Re-evaluated:Patient Re-evaluated prior to induction Oxygen Delivery Method: Circle system utilized Preoxygenation: Pre-oxygenation with 100% oxygen Induction Type: IV induction Ventilation: Mask ventilation without difficulty Laryngoscope Size: Mac and 4 Grade View: Grade II Tube type: Oral Tube size: 7.0 mm Number of attempts: 1 Airway Equipment and Method: Stylet Placement Confirmation: ETT inserted through vocal cords under direct vision, positive ETCO2, CO2 detector and breath sounds checked- equal and bilateral Secured at: 23 cm Tube secured with: Tape Dental Injury: Teeth and Oropharynx as per pre-operative assessment  Comments: ATOI. LMA switched to ett PSR.

## 2023-08-08 NOTE — Anesthesia Postprocedure Evaluation (Signed)
 Anesthesia Post Note  Patient: Frederick Beard  Procedure(s) Performed: INSERTION, RADIOACTIVE SEED INSERTION, BRACHYTHERAPY DEVICE, PROSTATE     Patient location during evaluation: PACU Anesthesia Type: General Level of consciousness: awake and alert Pain management: pain level controlled Vital Signs Assessment: post-procedure vital signs reviewed and stable Respiratory status: spontaneous breathing, nonlabored ventilation, respiratory function stable and patient connected to nasal cannula oxygen Cardiovascular status: blood pressure returned to baseline and stable Postop Assessment: no apparent nausea or vomiting Anesthetic complications: no  No notable events documented.  Last Vitals:  Vitals:   08/08/23 1100 08/08/23 1110  BP: 116/68 (!) 109/91  Pulse: 74 88  Resp: 14 15  Temp: (!) 36.2 C 36.4 C  SpO2: 94% 92%    Last Pain:  Vitals:   08/08/23 1110  TempSrc:   PainSc: 0-No pain                 Kennieth Rad

## 2023-08-09 ENCOUNTER — Encounter (HOSPITAL_COMMUNITY): Payer: Self-pay | Admitting: Urology

## 2023-08-10 NOTE — Progress Notes (Signed)
 Patient was a RadOnc Consult on 03/23/23 for his T1c, Gleason 3+4, adenocarcinoma of the prostate with a PSA of 5.33.  Patient proceed with treatment recommendations of brachytherapy and had his treatment on 08/08/23.   Patient is scheduled for a post seed CT Simulation on 3/26 and urology follow up on 4/7.

## 2023-08-27 ENCOUNTER — Other Ambulatory Visit: Payer: Self-pay | Admitting: Urology

## 2023-08-27 DIAGNOSIS — C61 Malignant neoplasm of prostate: Secondary | ICD-10-CM

## 2023-08-28 ENCOUNTER — Telehealth: Payer: Self-pay | Admitting: *Deleted

## 2023-08-28 NOTE — Progress Notes (Signed)
 Post-seed nursing interview for a diagnosis of Stage T1c, Gleason 3+4, adenocarcinoma of the prostate with a PSA of 5.33.  Patient identity verified x2.   Patient reports ***. Patient denies all other related issues at this time.  Meaningful use complete.  I-PSS (AUA) score- *** - {(BH) RANGE ABSENT/SEVERE:20013:s} SHIM (ED) score- *** Urinary Management medication(s) *** Urology appointment date- *** with Dr. Liliane Shi at [facility] Urology  Vitals- ***

## 2023-08-28 NOTE — Telephone Encounter (Signed)
 Called patient to remind of post seeds appts.and MRI for 08-30-23, lvm for a return call

## 2023-08-28 NOTE — Telephone Encounter (Signed)
XXXXX

## 2023-08-28 NOTE — Telephone Encounter (Signed)
 CALLED PATIENT TO REMIND OF POST SEED APPTS. FOR 08-30-23 AND HIS MRI FOR 08-30-23- ARRIVAL TIME- 11:45 AM @ [facility], PATIENT TO BE NPO- 4 HRS. PRIOR TO SCAN, PATIENT TO NOT HAVE CAFFEFINE OR CARB. DRINKS DAY OF SCAN, PATIENT TO NOT HAVE FAST FOOD DAY BEFORE SCAN, SPOKE WITH PATIENT AND HE IS AWARE OF THESE APPTS. AND THE INSTRUCTIONS

## 2023-08-29 NOTE — Progress Notes (Signed)
 Radiation Oncology         [phone]) [phone] ________________________________  Name: Frederick Beard MRN: [id number]  Date: 08/30/2023  DOB: 09-Nov-1950  Post-Seed Follow-Up Visit Note  CC: Andi Devon, MD  Shelly Rubenstein*  Diagnosis:   73 y.o. gentleman with Stage T1c, Gleason 3+4, adenocarcinoma of the prostate with a PSA of 5.33.     ICD-10-CM   1. Malignant neoplasm of prostate (HCC)  C61       Interval Since Last Radiation:  3 weeks 08/08/23:  Insertion of radioactive I-125 seeds into the prostate gland; 145 Gy, definitive therapy with placement of SpaceOAR gel.  Narrative:  The patient returns today for routine follow-up.  He is complaining of increased urinary frequency and urinary hesitation symptoms. He filled out a questionnaire regarding urinary function today providing and overall IPSS score of 20 characterizing his symptoms as severe but tolerable on Flomax BID as prescribed.  He reports nocturia 3-4 times per night, a weak flow of stream, hesitancy with occasional mild dysuria at the start of his stream, intermittency and feelings of incomplete bladder emptying but specifically denies dysuria, straining to void or gross hematuria.  His pre-implant score was 10. He denies any abdominal pain or bowel symptoms. His energy level remains satisfactory and overall, he is quite pleased with his progress to date.  ALLERGIES:  has no known allergies.  Meds: Current Outpatient Medications  Medication Sig Dispense Refill   Acetylcysteine (NAC PO) Take 1 capsule by mouth at bedtime.     amLODipine (NORVASC) 5 MG tablet Take 5 mg by mouth daily.     aspirin EC 81 MG tablet Take 81 mg by mouth daily. Swallow whole.     B Complex-Biotin-FA (B COMPLETE PO) Take 1 capsule by mouth at bedtime.     Cholecalciferol (VITAMIN D3 ULTRA STRENGTH) 125 MCG (5000 UT) capsule Take 5,000 Units by mouth daily.     Coenzyme Q10 (CO Q-10) 100 MG CAPS Take 100 mg by mouth daily.      FeFum-FePoly-FA-B Cmp-C-Biot (INTEGRA PLUS PO) Take 1 capsule by mouth at bedtime.     hydrocortisone (CORTEF) 20 MG tablet Take 20 mg by mouth daily.     hydrocortisone (CORTEF) 5 MG tablet Take 5 mg by mouth at bedtime.     latanoprost (XALATAN) 0.005 % ophthalmic solution Place 1 drop into both eyes at bedtime.     levothyroxine (SYNTHROID) 100 MCG tablet Take 100 mcg by mouth daily before breakfast.     Misc Natural Products (PROSTATE SUPPORT PO) Take 1 capsule by mouth 2 (two) times daily.     Multiple Vitamins-Minerals (EMERGEN-C IMMUNE) PACK Take 1 packet by mouth daily.     Omega-3 Fatty Acids (FISH OIL ULTRA) 1400 MG CAPS Take 1,400 mg by mouth daily.     Probiotic Product (PROBIOTIC PO) Take 1 capsule by mouth at bedtime. UltraFlora IB     RESVERATROL PO Take 1 capsule by mouth daily.     rosuvastatin (CRESTOR) 20 MG tablet Take 20 mg by mouth at bedtime.     tadalafil (CIALIS) 5 MG tablet Take 5 mg by mouth at bedtime.     tamsulosin (FLOMAX) 0.4 MG CAPS capsule Take 0.4 mg by mouth 2 (two) times daily.     Testosterone 20.25 MG/1.25GM (1.62%) GEL Place 3 packets onto the skin daily.     No current facility-administered medications for this encounter.    Physical Findings: In general this is a well appearing African American  male in no acute distress. He's alert and oriented x4 and appropriate throughout the examination. Cardiopulmonary assessment is negative for acute distress and he exhibits normal effort.   Lab Findings: Lab Results  Component Value Date   WBC 8.2 07/25/2023   HGB 13.3 07/25/2023   HCT 42.1 07/25/2023   MCV 100.7 (H) 07/25/2023   PLT 217 07/25/2023    Radiographic Findings:  Patient underwent CT imaging in our clinic for post implant dosimetry. The CT will be reviewed by Dr. Kathrynn Running to confirm there is an adequate distribution of radioactive seeds throughout the prostate gland and ensure that there are no seeds in or near the rectum.  We suspect the  final radiation plan and dosimetry will show appropriate coverage of the prostate gland. He understands that we will call and inform him of any unexpected findings on further review of his imaging and dosimetry.  Impression/Plan: 73 y.o. gentleman with Stage T1c, Gleason 3+4, adenocarcinoma of the prostate with a PSA of 5.33.  The patient is recovering from the effects of radiation. His urinary symptoms should gradually improve over the next 4-6 months. We talked about this today. He is encouraged by his improvement already and is otherwise pleased with his outcome. We also talked about long-term follow-up for prostate cancer following seed implant. He understands that ongoing PSA determinations and digital rectal exams will help perform surveillance to rule out disease recurrence. He has a follow up appointment scheduled with Anne Fu, NP on 09/11/23. He understands what to expect with his PSA measures. Patient was also educated today about some of the long-term effects from radiation including a small risk for rectal bleeding and possibly erectile dysfunction. We talked about some of the general management approaches to these potential complications. However, I did encourage the patient to contact our office or return at any point if he has questions or concerns related to his previous radiation and prostate cancer.    Marguarite Arbour, PA-C

## 2023-08-29 NOTE — Progress Notes (Signed)
  Radiation Oncology         [phone] ________________________________  Name: Frederick Beard MRN: [id number]  Date: 08/30/2023  DOB: 11-10-1950  COMPLEX SIMULATION NOTE  NARRATIVE:  The patient was brought to the CT Simulation planning suite today following prostate seed implantation approximately one month ago.  Identity was confirmed.  All relevant records and images related to the planned course of therapy were reviewed.  Then, the patient was set-up supine.  CT images were obtained.  The CT images were loaded into the planning software.  Then the prostate and rectum were contoured.  Treatment planning then occurred.  The implanted iodine 125 seeds were identified by the physics staff for projection of radiation distribution  I have requested : 3D Simulation  I have requested a DVH of the following structures: Prostate and rectum.    ________________________________  Artist Pais Kathrynn Running, M.D.

## 2023-08-30 ENCOUNTER — Ambulatory Visit
Admission: RE | Admit: 2023-08-30 | Discharge: 2023-08-30 | Disposition: A | Discharge status: Home / Self Care | Source: Ambulatory Visit | Attending: Radiation Oncology | Admitting: Radiation Oncology

## 2023-08-30 ENCOUNTER — Encounter: Payer: Self-pay | Admitting: Urology

## 2023-08-30 ENCOUNTER — Ambulatory Visit (HOSPITAL_COMMUNITY)
Admission: RE | Admit: 2023-08-30 | Discharge: 2023-08-30 | Disposition: A | Discharge status: Home / Self Care | Source: Ambulatory Visit | Attending: Urology | Admitting: Urology

## 2023-08-30 ENCOUNTER — Ambulatory Visit
Admission: RE | Admit: 2023-08-30 | Discharge: 2023-08-30 | Disposition: A | Discharge status: Home / Self Care | Payer: Self-pay | Source: Ambulatory Visit | Attending: Urology | Admitting: Urology

## 2023-08-30 VITALS — BP 144/88 | HR 65 | Temp 97.6°F | Resp 18 | Ht 70.5 in | Wt 184.4 lb

## 2023-08-30 DIAGNOSIS — Z79890 Hormone replacement therapy (postmenopausal): Secondary | ICD-10-CM | POA: Insufficient documentation

## 2023-08-30 DIAGNOSIS — C61 Malignant neoplasm of prostate: Secondary | ICD-10-CM | POA: Insufficient documentation

## 2023-08-30 DIAGNOSIS — R3911 Hesitancy of micturition: Secondary | ICD-10-CM | POA: Insufficient documentation

## 2023-08-30 DIAGNOSIS — Z79899 Other long term (current) drug therapy: Secondary | ICD-10-CM | POA: Insufficient documentation

## 2023-08-30 DIAGNOSIS — Z923 Personal history of irradiation: Secondary | ICD-10-CM | POA: Insufficient documentation

## 2023-08-30 DIAGNOSIS — Z7982 Long term (current) use of aspirin: Secondary | ICD-10-CM | POA: Insufficient documentation

## 2023-08-30 DIAGNOSIS — R350 Frequency of micturition: Secondary | ICD-10-CM | POA: Insufficient documentation

## 2023-08-30 NOTE — Addendum Note (Signed)
 Encounter addended by: Merrie Roof, RN on: 08/30/2023 9:21 AM  Actions taken: Reconcile Outside Information medication data discarded, Medication List reviewed, Problem List reviewed, Charge Capture section accepted

## 2023-08-31 ENCOUNTER — Encounter: Payer: Self-pay | Admitting: *Deleted

## 2023-09-04 ENCOUNTER — Encounter: Payer: Self-pay | Admitting: Radiation Oncology

## 2023-09-04 DIAGNOSIS — C61 Malignant neoplasm of prostate: Secondary | ICD-10-CM | POA: Diagnosis not present

## 2023-09-04 NOTE — Radiation Completion Notes (Addendum)
  Radiation Oncology         [phone] ________________________________  Name: Brayn Eckstein MRN: [id number]  Date: 09/04/2023  DOB: 1950-11-10  Referring Physician: Lonni Han, M.D. Date of Service: 2023-09-04 Radiation Oncologist: Adina Barge, M.D. [facility]     RADIATION ONCOLOGY END OF TREATMENT NOTE     Diagnosis: 73 year old man with Stage T1c, Gleason 3+4, adenocarcinoma of the prostate with a PSA of 5.33.   Intent: Curative     ==========DELIVERED PLANS==========  Prostate Seed Implant Date: 2023-08-08   Plan Name: Prostate Seed Implant Site: Prostate Technique: Radioactive Seed Implant I-125 Mode: Brachytherapy Dose Per Fraction: 145 Gy Prescribed Dose (Delivered / Prescribed): 145 Gy / 145 Gy Prescribed Fxs (Delivered / Prescribed): 1 / 1     ==========ON TREATMENT VISIT DATES========== 2023-08-08     ------------------------------------------------   Donnice Barge, MD [facility]  Radiation Oncology Direct Dial: [phone]  Fax: [phone] [email or web address]  Skype  LinkedIn

## 2023-09-04 NOTE — Progress Notes (Signed)
  Radiation Oncology         [phone]) [phone] ________________________________  Name: Frederick Beard MRN: [id number]  Date: 09/04/2023  DOB: 08-Sep-1950  3D Planning Note   Prostate Brachytherapy Post-Implant Dosimetry  Diagnosis: 73 year old man with Stage T1c, Gleason 3+4, adenocarcinoma of the prostate with a PSA of 5.33.   Narrative: On a previous date, Frederick Beard returned following prostate seed implantation for post implant planning. He underwent CT scan complex simulation to delineate the three-dimensional structures of the pelvis and demonstrate the radiation distribution.  Since that time, the seed localization, and complex isodose planning with dose volume histograms have now been completed.  Results:   Prostate Coverage - The dose of radiation delivered to the 90% or more of the prostate gland (D90) was 105.8% of the prescription dose. This exceeds our goal of greater than 90%. Rectal Sparing - The volume of rectal tissue receiving the prescription dose or higher was 0.0 cc. This falls under our thresholds tolerance of 1.0 cc.  Impression: The prostate seed implant appears to show adequate target coverage and appropriate rectal sparing.  Plan:  The patient will continue to follow with urology for ongoing PSA determinations. I would anticipate a high likelihood for local tumor control with minimal risk for rectal morbidity.  ________________________________  Artist Pais Kathrynn Running, M.D.

## 2023-09-08 ENCOUNTER — Encounter: Payer: Self-pay | Admitting: *Deleted

## 2023-09-08 ENCOUNTER — Inpatient Hospital Stay: Attending: Internal Medicine | Admitting: *Deleted

## 2023-09-08 DIAGNOSIS — C61 Malignant neoplasm of prostate: Secondary | ICD-10-CM

## 2023-09-08 NOTE — Progress Notes (Signed)
 SCP reviewed and completed. Allergies and medications UTD. Pt denies smoking and not currently drinking. Last colonoscopy was . Pt will get post-tx PSA labs in June and OV with Dr. Liliane Shi at [facility].  Vaccinations are UTD. Pt had annual wellness checkup with PCP on March 18th. Pt is exercising regularly and eating well. Overall, pt says, he feels like he is doing very well.

## 2023-11-03 ENCOUNTER — Ambulatory Visit: Payer: Medicare Other | Admitting: Cardiology

## 2023-11-14 ENCOUNTER — Ambulatory Visit: Attending: Cardiology | Admitting: Cardiology

## 2023-11-14 ENCOUNTER — Ambulatory Visit

## 2023-11-14 ENCOUNTER — Encounter: Payer: Self-pay | Admitting: Cardiology

## 2023-11-14 VITALS — BP 110/80 | HR 65 | Ht 70.0 in | Wt 183.8 lb

## 2023-11-14 DIAGNOSIS — E782 Mixed hyperlipidemia: Secondary | ICD-10-CM | POA: Diagnosis not present

## 2023-11-14 DIAGNOSIS — I340 Nonrheumatic mitral (valve) insufficiency: Secondary | ICD-10-CM | POA: Diagnosis not present

## 2023-11-14 DIAGNOSIS — R001 Bradycardia, unspecified: Secondary | ICD-10-CM | POA: Insufficient documentation

## 2023-11-14 DIAGNOSIS — I10 Essential (primary) hypertension: Secondary | ICD-10-CM | POA: Diagnosis present

## 2023-11-14 NOTE — Progress Notes (Unsigned)
 Enrolled patient for a 14 day Zio XT  monitor to be mailed to patients home

## 2023-11-14 NOTE — Patient Instructions (Signed)
 Medication Instructions:  Your physician recommends that you continue on your current medications as directed. Please refer to the Current Medication list given to you today.    *If you need a refill on your cardiac medications before your next appointment, please call your pharmacy*   Lab Work: NONE    If you have labs (blood work) drawn today and your tests are completely normal, you will receive your results only by: MyChart Message (if you have MyChart) OR A paper copy in the mail If you have any lab test that is abnormal or we need to change your treatment, we will call you to review the results.   Testing/Procedures: Delane Fear- Long Term Monitor Instructions  Your physician has requested you wear a ZIO patch monitor for 14 days.  This is a single patch monitor. [employer] supplies one patch monitor per enrollment. Additional stickers are not available. Please do not apply patch if you will be having a Nuclear Stress Test,  Echocardiogram, Cardiac CT, MRI, or Chest Xray during the period you would be wearing the  monitor. The patch cannot be worn during these tests. You cannot remove and re-apply the  ZIO XT patch monitor.  Your ZIO patch monitor will be mailed 3 day USPS to your address on file. It may take 3-5 days  to receive your monitor after you have been enrolled.  Once you have received your monitor, please review the enclosed instructions. Your monitor  has already been registered assigning a specific monitor serial # to you.  Billing and Patient Assistance Program Information  We have supplied [employer] with any of your insurance information on file for billing purposes. [employer] offers a sliding scale Patient Assistance Program for patients that do not have  insurance, or whose insurance does not completely cover the cost of the ZIO monitor.  You must apply for the Patient Assistance Program to qualify for this discounted rate.  To apply, please call [employer] at  [phone], select option 4, select option 2, ask to apply for  Patient Assistance Program. Sanna Crystal will ask your household income, and how many people  are in your household. They will quote your out-of-pocket cost based on that information.  [employer] will also be able to set up a 56-month, interest-free payment plan if needed.  Applying the monitor   Shave hair from upper left chest.  Hold abrader disc by orange tab. Rub abrader in 40 strokes over the upper left chest as  indicated in your monitor instructions.  Clean area with 4 enclosed alcohol  pads. Let dry.  Apply patch as indicated in monitor instructions. Patch will be placed under collarbone on left  side of chest with arrow pointing upward.  Rub patch adhesive wings for 2 minutes. Remove white label marked "1". Remove the white  label marked "2". Rub patch adhesive wings for 2 additional minutes.  While looking in a mirror, press and release button in center of patch. A small green light will  flash 3-4 times. This will be your only indicator that the monitor has been turned on.  Do not shower for the first 24 hours. You may shower after the first 24 hours.  Press the button if you feel a symptom. You will hear a small click. Record Date, Time and  Symptom in the Patient Logbook.  When you are ready to remove the patch, follow instructions on the last 2 pages of Patient  Logbook. Stick patch monitor onto the last page of Patient Logbook.  Place Patient Logbook in the blue and white box. Use locking tab on box and tape box closed  securely. The blue and white box has prepaid postage on it. Please place it in the mailbox as  soon as possible. Your physician should have your test results approximately 7 days after the  monitor has been mailed back to [facility].  Call [facility] Customer Care at [phone] if you have questions regarding  your ZIO XT patch monitor. Call them immediately if you see an orange light  blinking on your  monitor.  If your monitor falls off in less than 4 days, contact our Monitor department at [phone].  If your monitor becomes loose or falls off after 4 days call [employer] at [phone] for  suggestions on securing your monitor    Follow-Up: At [facility], you and your health needs are our priority.  As part of our continuing mission to provide you with exceptional heart care, we have created designated Provider Care Teams.  These Care Teams include your primary Cardiologist (physician) and Advanced Practice Providers (APPs -  Physician Assistants and Nurse Practitioners) who all work together to provide you with the care you need, when you need it.  We recommend signing up for the patient portal called "MyChart".  Sign up information is provided on this After Visit Summary.  MyChart is used to connect with patients for Virtual Visits (Telemedicine).  Patients are able to view lab/test results, encounter notes, upcoming appointments, etc.  Non-urgent messages can be sent to your provider as well.   To learn more about what you can do with MyChart, go to [email or web address].    Your next appointment:   1 year(s)  The format for your next appointment:   In Person  Provider:   Kardie Tobb, DO   Other Instructions

## 2023-11-14 NOTE — Progress Notes (Addendum)
 Cardiology Office Note:    Date:  11/18/2023   ID:  Frederick Beard, DOB Jul 15, 1950, MRN [id number]  PCP:  Yolanda Hence, MD  Cardiologist:  Jerryl Morin, DO  Electrophysiologist:  None   Referring MD: Yolanda Hence, MD    I am doing well   History of Present Illness:    Frederick Beard is a 73 y.o. male with a hx of hypertension, mild to moderate mitral regurgitaton, noted occasional second degree type 1 AV block, with and bradycardia who presents for a cardiovascular follow-up.   He offers no complaints today.   He currently has no symptoms such as chest pain, shortness of breath, lightheadedness, or dizziness. He is not planning any extended travel in the near future.  Past Medical History:  Diagnosis Date   Anemia    Arthritis    Hypertension    Prostate cancer (HCC)    Tuberculosis    positive for TB in 1980, treated while in the [employer]    Past Surgical History:  Procedure Laterality Date   FRACTURE SURGERY Right 2018   ORIF elbow fx   PROSTATE BIOPSY     RADIOACTIVE SEED IMPLANT N/A 08/08/2023   Procedure: INSERTION, RADIOACTIVE SEED;  Surgeon: Adelbert Homans, MD;  Location: [facility];  Service: Urology;  Laterality: N/A;   SPACE OAR INSTILLATION N/A 08/08/2023   Procedure: INSERTION, BRACHYTHERAPY DEVICE, PROSTATE;  Surgeon: Adelbert Homans, MD;  Location: [facility];  Service: Urology;  Laterality: N/A;   TONSILLECTOMY     TRANSPHENOIDAL / TRANSNASAL HYPOPHYSECTOMY / RESECTION PITUITARY TUMOR  1983   for benign adenoma    Current Medications: Current Meds  Medication Sig   Acetylcysteine (NAC PO) Take 1 capsule by mouth at bedtime.   amLODipine (NORVASC) 5 MG tablet Take 5 mg by mouth daily.   aspirin EC 81 MG tablet Take 81 mg by mouth daily. Swallow whole.   B Complex-Biotin-FA (B COMPLETE PO) Take 1 capsule by mouth at bedtime.   Cholecalciferol (VITAMIN D3 ULTRA STRENGTH) 125 MCG (5000 UT) capsule Take 5,000 Units by mouth daily.    Coenzyme Q10 (CO Q-10) 100 MG CAPS Take 100 mg by mouth daily.   FeFum-FePoly-FA-B Cmp-C-Biot (INTEGRA PLUS PO) Take 1 capsule by mouth at bedtime.   hydrocortisone (CORTEF) 20 MG tablet Take 20 mg by mouth daily.   hydrocortisone (CORTEF) 5 MG tablet Take 5 mg by mouth at bedtime.   latanoprost (XALATAN) 0.005 % ophthalmic solution Place 1 drop into both eyes at bedtime.   levothyroxine (SYNTHROID) 100 MCG tablet Take 100 mcg by mouth daily before breakfast.   Misc Natural Products (PROSTATE SUPPORT PO) Take 1 capsule by mouth 2 (two) times daily.   Multiple Vitamins-Minerals (EMERGEN-C IMMUNE) PACK Take 1 packet by mouth daily.   Omega-3 Fatty Acids (FISH OIL ULTRA) 1400 MG CAPS Take 1,400 mg by mouth daily.   pantoprazole (PROTONIX) 40 MG tablet Take 40 mg by mouth daily.   Probiotic Product (PROBIOTIC PO) Take 1 capsule by mouth at bedtime. UltraFlora IB   RESVERATROL PO Take 1 capsule by mouth daily.   rosuvastatin (CRESTOR) 20 MG tablet Take 20 mg by mouth at bedtime.   tadalafil (CIALIS) 5 MG tablet Take 5 mg by mouth at bedtime.   tamsulosin (FLOMAX) 0.4 MG CAPS capsule Take 0.4 mg by mouth 2 (two) times daily.   Testosterone 20.25 MG/1.25GM (1.62%) GEL Place 3 packets onto the skin daily.     Allergies:   Patient has  no known allergies.   Social History   Socioeconomic History   Marital status: Married    Spouse name: Not on file   Number of children: Not on file   Years of education: Not on file   Highest education level: Not on file  Occupational History   Occupation: Production designer, theatre/television/film    Comment: retired  Tobacco Use   Smoking status: Never   Smokeless tobacco: Never  Vaping Use   Vaping status: Never Used  Substance and Sexual Activity   Alcohol use: Not Currently   Drug use: Never   Sexual activity: Yes  Other Topics Concern   Not on file  Social History Narrative   Not on file   Social Drivers of Health   Financial Resource Strain: Not on file  Food  Insecurity: No Food Insecurity (07/12/2023)   Hunger Vital Sign    Worried About Running Out of Food in the Last Year: Never true    Ran Out of Food in the Last Year: Never true  Transportation Needs: No Transportation Needs (07/12/2023)   PRAPARE - Administrator, Civil Service (Medical): No    Lack of Transportation (Non-Medical): No  Physical Activity: Not on file  Stress: Not on file  Social Connections: Not on file     Family History: The patient's family history includes Prostate cancer in his brother and brother. There is no history of Breast cancer, Colon cancer, or Pancreatic cancer.  ROS:   Review of Systems  Constitution: Negative for decreased appetite, fever and weight gain.  HENT: Negative for congestion, ear discharge, hoarse voice and sore throat.   Eyes: Negative for discharge, redness, vision loss in right eye and visual halos.  Cardiovascular: Negative for chest pain, dyspnea on exertion, leg swelling, orthopnea and palpitations.  Respiratory: Negative for cough, hemoptysis, shortness of breath and snoring.   Endocrine: Negative for heat intolerance and polyphagia.  Hematologic/Lymphatic: Negative for bleeding problem. Does not bruise/bleed easily.  Skin: Negative for flushing, nail changes, rash and suspicious lesions.  Musculoskeletal: Negative for arthritis, joint pain, muscle cramps, myalgias, neck pain and stiffness.  Gastrointestinal: Negative for abdominal pain, bowel incontinence, diarrhea and excessive appetite.  Genitourinary: Negative for decreased libido, genital sores and incomplete emptying.  Neurological: Negative for brief paralysis, focal weakness, headaches and loss of balance.  Psychiatric/Behavioral: Negative for altered mental status, depression and suicidal ideas.  Allergic/Immunologic: Negative for HIV exposure and persistent infections.    EKGs/Labs/Other Studies Reviewed:    The following studies were reviewed today:   EKG:   The ekg ordered today demonstrates   Recent Labs: 07/25/2023: BUN 21; Creatinine, Ser 1.21; Hemoglobin 13.3; Platelets 217; Potassium 3.8; Sodium 142  Recent Lipid Panel No results found for: CHOL, TRIG, HDL, CHOLHDL, VLDL, LDLCALC, LDLDIRECT  Physical Exam:    VS:  BP 110/80 (BP Location: Right Arm, Patient Position: Sitting, Cuff Size: Normal)   Pulse 65   Ht 5' 10 (1.778 m)   Wt 183 lb 12.8 oz (83.4 kg)   SpO2 96%   BMI 26.37 kg/m     Wt Readings from Last 3 Encounters:  11/14/23 183 lb 12.8 oz (83.4 kg)  08/30/23 184 lb 6.4 oz (83.6 kg)  08/08/23 188 lb (85.3 kg)     GEN: Well nourished, well developed in no acute distress HEENT: Normal NECK: No JVD; No carotid bruits LYMPHATICS: No lymphadenopathy CARDIAC: S1S2 noted,RRR, no murmurs, rubs, gallops RESPIRATORY:  Clear to auscultation without rales, wheezing or  rhonchi  ABDOMEN: Soft, non-tender, non-distended, +bowel sounds, no guarding. EXTREMITIES: No edema, No cyanosis, no clubbing MUSCULOSKELETAL:  No deformity  SKIN: Warm and dry NEUROLOGIC:  Alert and oriented x 3, non-focal PSYCHIATRIC:  Normal affect, good insight  ASSESSMENT:    1. Bradycardia   2. Essential hypertension   3. Mixed hyperlipidemia   4. Nonrheumatic mitral valve regurgitation    PLAN:     Hypertension Blood pressure well-controlled on current regimen. Continued management necessary to prevent diastolic dysfunction progression. - Continue amlodipine 5 mg daily. - Continue aspirin. - Continue Crestor.  Mild to moderate mitral valve regurgitation Echocardiogram shows mild regurgitation without symptoms. No immediate intervention required unless symptoms develop. - Repeat echocardiogram in 2-3 years unless symptoms develop.  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 I would like to get more understanding. He will benefit from ambulatory  montior.  Follow-up Plan to monitor cardiac function and symptoms with a 14-day cardiac monitor. Follow-up based on monitor results and symptoms. - Apply 14-day cardiac monitor. - Schedule virtual follow-up if monitor results indicate issues. - Set general follow-up appointment in one year.  The patient is in agreement with the above plan. The patient left the office in stable condition.  The patient will follow up in   Medication Adjustments/Labs and Tests Ordered: Current medicines are reviewed at length with the patient today.  Concerns regarding medicines are outlined above.  Orders Placed This Encounter  Procedures   LONG TERM MONITOR (3-14 DAYS)   No orders of the defined types were placed in this encounter.   Patient Instructions  Medication Instructions:  Your physician recommends that you continue on your current medications as directed. Please refer to the Current Medication list given to you today.    *If you need a refill on your cardiac medications before your next appointment, please call your pharmacy*   Lab Work: NONE    If you have labs (blood work) drawn today and your tests are completely normal, you will receive your results only by: MyChart Message (if you have MyChart) OR A paper copy in the mail If you have any lab test that is abnormal or we need to change your treatment, we will call you to review the results.   Testing/Procedures: Delane Fear- Long Term Monitor Instructions  Your physician has requested you wear a ZIO patch monitor for 14 days.  This is a single patch monitor. [employer] supplies one patch monitor per enrollment. Additional stickers are not available. Please do not apply patch if you will be having a Nuclear Stress Test,  Echocardiogram, Cardiac CT, MRI, or Chest Xray during the period you would be wearing the  monitor. The patch cannot be worn during these tests. You cannot remove and re-apply the  ZIO XT patch monitor.  Your ZIO  patch monitor will be mailed 3 day USPS to your address on file. It may take 3-5 days  to receive your monitor after you have been enrolled.  Once you have received your monitor, please review the enclosed instructions. Your monitor  has already been registered assigning a specific monitor serial # to you.  Billing and Patient Assistance Program Information  We have supplied [employer] with any of your insurance information on file for billing purposes. [employer] offers a sliding scale Patient Assistance Program for patients that do not have  insurance, or whose insurance does not completely  cover the cost of the ZIO monitor.  You must apply for the Patient Assistance Program to qualify for this discounted rate.  To apply, please call [employer] at [phone], select option 4, select option 2, ask to apply for  Patient Assistance Program. Sanna Crystal will ask your household income, and how many people  are in your household. They will quote your out-of-pocket cost based on that information.  [employer] will also be able to set up a 68-month, interest-free payment plan if needed.  Applying the monitor   Shave hair from upper left chest.  Hold abrader disc by orange tab. Rub abrader in 40 strokes over the upper left chest as  indicated in your monitor instructions.  Clean area with 4 enclosed alcohol pads. Let dry.  Apply patch as indicated in monitor instructions. Patch will be placed under collarbone on left  side of chest with arrow pointing upward.  Rub patch adhesive wings for 2 minutes. Remove white label marked 1. Remove the white  label marked 2. Rub patch adhesive wings for 2 additional minutes.  While looking in a mirror, press and release button in center of patch. A small green light will  flash 3-4 times. This will be your only indicator that the monitor has been turned on.  Do not shower for the first 24 hours. You may shower after the first 24 hours.  Press the button if you feel a  symptom. You will hear a small click. Record Date, Time and  Symptom in the Patient Logbook.  When you are ready to remove the patch, follow instructions on the last 2 pages of Patient  Logbook. Stick patch monitor onto the last page of Patient Logbook.  Place Patient Logbook in the blue and white box. Use locking tab on box and tape box closed  securely. The blue and white box has prepaid postage on it. Please place it in the mailbox as  soon as possible. Your physician should have your test results approximately 7 days after the  monitor has been mailed back to [facility].  Call [facility] Customer Care at [phone] if you have questions regarding  your ZIO XT patch monitor. Call them immediately if you see an orange light blinking on your  monitor.  If your monitor falls off in less than 4 days, contact our Monitor department at [phone].  If your monitor becomes loose or falls off after 4 days call [employer] at [phone] for  suggestions on securing your monitor    Follow-Up: At [facility], you and your health needs are our priority.  As part of our continuing mission to provide you with exceptional heart care, we have created designated Provider Care Teams.  These Care Teams include your primary Cardiologist (physician) and Advanced Practice Providers (APPs -  Physician Assistants and Nurse Practitioners) who all work together to provide you with the care you need, when you need it.  We recommend signing up for the patient portal called MyChart.  Sign up information is provided on this After Visit Summary.  MyChart is used to connect with patients for Virtual Visits (Telemedicine).  Patients are able to view lab/test results, encounter notes, upcoming appointments, etc.  Non-urgent messages can be sent to your provider as well.   To learn more about what you can do with MyChart, go to [email or web address].    Your next appointment:   1 year(s)  The format  for your next appointment:   In Person  Provider:   Kyshaun Barnette  Jenene Kauffmann, DO   Other Instructions     Adopting a Healthy Lifestyle.  Know what a healthy weight is for you (roughly BMI <25) and aim to maintain this   Aim for 7+ servings of fruits and vegetables daily   65-80+ fluid ounces of water  or unsweet tea for healthy kidneys   Limit to max 1 drink of alcohol per day; avoid smoking/tobacco   Limit animal fats in diet for cholesterol and heart health - choose grass fed whenever available   Avoid highly processed foods, and foods high in saturated/trans fats   Aim for low stress - take time to unwind and care for your mental health   Aim for 150 min of moderate intensity exercise weekly for heart health, and weights twice weekly for bone health   Aim for 7-9 hours of sleep daily   When it comes to diets, agreement about the perfect plan isnt easy to find, even among the experts. Experts at the [facility] [employer] developed an idea known as the Healthy Eating Plate. Just imagine a plate divided into logical, healthy portions.   The emphasis is on diet quality:   Load up on vegetables and fruits - one-half of your plate: Aim for color and variety, and remember that potatoes dont count.   Go for whole grains - one-quarter of your plate: Whole wheat, barley, wheat berries, quinoa, oats, brown rice, and foods made with them. If you want pasta, go with whole wheat pasta.   Protein power - one-quarter of your plate: Fish, chicken, beans, and nuts are all healthy, versatile protein sources. Limit red meat.   The diet, however, does go beyond the plate, offering a few other suggestions.   Use healthy plant oils, such as olive, canola, soy, corn, sunflower and peanut. Check the labels, and avoid partially hydrogenated oil, which have unhealthy trans fats.   If youre thirsty, drink water . Coffee and tea are good in moderation, but skip sugary drinks and limit milk and dairy  products to one or two daily servings.   The type of carbohydrate in the diet is more important than the amount. Some sources of carbohydrates, such as vegetables, fruits, whole grains, and beans-are healthier than others.   Finally, stay active  Signed, Jerryl Morin, DO  11/18/2023 6:22 AM    [facility]

## 2023-12-14 ENCOUNTER — Ambulatory Visit: Payer: Self-pay | Admitting: Cardiology

## 2023-12-14 ENCOUNTER — Telehealth: Payer: Self-pay | Admitting: Cardiology

## 2023-12-14 DIAGNOSIS — R9431 Abnormal electrocardiogram [ECG] [EKG]: Secondary | ICD-10-CM

## 2023-12-14 DIAGNOSIS — R001 Bradycardia, unspecified: Secondary | ICD-10-CM

## 2023-12-14 NOTE — Telephone Encounter (Signed)
 [employer] calling to report urgent EKG results.

## 2023-12-14 NOTE — Telephone Encounter (Signed)
 Returned call to patient, he said he does not recall having any symptoms or feeling unusual on 11/30/23 at 1:51 AM, no symptoms since removing the heart monitor.  Informed patient we will follow-up with him after Dr. Sheena reviews heart monitor results. Patient verbalized understanding and expressed appreciation for call.

## 2023-12-14 NOTE — Telephone Encounter (Signed)
 Call from [facility] transferred to triage.  On 11/30/2023 at 1:51 AM, heart monitor showed complete heart block, with HR 34-35 bpm lasting for 8 seconds. Event was auto triggered, no symptoms reported by patient.  Event can be found on final report page 14, strip #7.   Attempted to contact patient to find out of he had any symptoms of if he was sleeping during this time Left message informing patient we will follow-up after Dr. Sheena reviews these results.

## 2023-12-14 NOTE — Telephone Encounter (Signed)
Follow Up:      Patient  is returning a call from today, concerning his results.

## 2023-12-15 NOTE — Telephone Encounter (Signed)
 Rolin CHRISTELLA Qualia, RN 12/14/2023  4:03 PM EDT     Referral placed for EP    Kardie Tobb, DO 12/14/2023  3:58 PM EDT     Please send an urgent referral to EP. Please let the patient know I will call him in the am to discuss the test result.

## 2024-01-01 ENCOUNTER — Encounter: Payer: Self-pay | Admitting: Cardiology

## 2024-01-01 ENCOUNTER — Ambulatory Visit: Attending: Cardiology | Admitting: Cardiology

## 2024-01-01 VITALS — BP 150/80 | HR 64 | Ht 70.0 in | Wt 189.0 lb

## 2024-01-01 DIAGNOSIS — I442 Atrioventricular block, complete: Secondary | ICD-10-CM | POA: Diagnosis not present

## 2024-01-01 DIAGNOSIS — I10 Essential (primary) hypertension: Secondary | ICD-10-CM | POA: Diagnosis present

## 2024-01-01 DIAGNOSIS — I441 Atrioventricular block, second degree: Secondary | ICD-10-CM | POA: Insufficient documentation

## 2024-01-01 NOTE — Progress Notes (Signed)
  Electrophysiology Office Note:   Date:  01/01/2024  ID:  Frederick Beard, DOB [birth year], MRN [id number]  Primary Cardiologist: Dub Huntsman, DO Primary Heart Failure: None Electrophysiologist: None      History of Present Illness:   Frederick Beard is a 73 y.o. male with h/o hypertension, hyperlipidemia seen today for  for Electrophysiology evaluation of heart block at the request of Kardie Tobb.    He initially presented to cardiology clinic for evaluation of an abnormal EKG.  He is active, exercising 2-3 times a week, walking up to 4 miles.  He has no history of CVA or MI.  On follow-up, he was found to have significant bradycardia with Mobitz 1 AV block.  He is not on an AV nodal blocker, but has been minimally symptomatic.  He can exercise.  He likes to walk and likes to stay in shape.  He has had no syncope or near syncope.  He was unaware of bradycardia while wearing his monitor.  Review of systems complete and found to be negative unless listed in HPI.   EP Information / Studies Reviewed:    EKG is ordered today. Personal review as below.  EKG Interpretation Date/Time:  Monday January 01 2024 15:37:28 EDT Ventricular Rate:  64 PR Interval:  258 QRS Duration:  80 QT Interval:  366 QTC Calculation: 377 R Axis:   -14  Text Interpretation: Sinus rhythm with 1st degree A-V block When compared with ECG of 25-Jul-2023 10:56, No significant change was found Confirmed by Koven Belinsky ([id number]) on 01/01/2024 3:40:09 PM     Risk Assessment/Calculations:            Physical Exam:   VS:  BP (!) 150/80   Pulse 64   Ht 5' 10 (1.778 m)   Wt 189 lb 0.6 oz (85.7 kg)   SpO2 95%   BMI 27.12 kg/m    Wt Readings from Last 3 Encounters:  01/01/24 189 lb 0.6 oz (85.7 kg)  11/14/23 183 lb 12.8 oz (83.4 kg)  08/30/23 184 lb 6.4 oz (83.6 kg)     GEN: Well nourished, well developed in no acute distress NECK: No JVD; No carotid bruits CARDIAC: Regular rate and rhythm, no murmurs, rubs,  gallops RESPIRATORY:  Clear to auscultation without rales, wheezing or rhonchi  ABDOMEN: Soft, non-tender, non-distended EXTREMITIES:  No edema; No deformity   ASSESSMENT AND PLAN:    1.  High-grade AV block: Has a few episodes of high-grade AV block on cardiac monitor.  Most of these episodes were nocturnal, but he did have 1 episode at 12:30 PM.  He was completely asymptomatic from this episode.  He had no episodes of syncope or near syncope.  Additionally, he had a narrow QRS complex which was similar in morphology to his sinus QRS complex.  He likely has a stable escape rhythm.  As he is minimally symptomatic, no further workup is needed at this time.  He may need a pacemaker in the future, but would hold off for now.  2.  Hypertension: Currently well-controlled.  Plan per primary cardiology  Discussed with primary cardiology  Follow up with Dr. Inocencio as needed   Signed, Aalina Brege Gladis Inocencio, MD

## 2024-07-09 ENCOUNTER — Ambulatory Visit
Admission: RE | Admit: 2024-07-09 | Discharge: 2024-07-09 | Disposition: A | Source: Ambulatory Visit | Attending: Internal Medicine | Admitting: Internal Medicine

## 2024-07-09 ENCOUNTER — Other Ambulatory Visit: Payer: Self-pay | Admitting: Internal Medicine

## 2024-07-09 DIAGNOSIS — M25551 Pain in right hip: Secondary | ICD-10-CM
